# Patient Record
Sex: Male | Born: 1945 | Race: White | Hispanic: No | Smoking: Former smoker
Health system: Southern US, Community
[De-identification: ages and names within clinical notes are randomized; demographics above are authoritative.]

## PROBLEM LIST (undated history)

## (undated) DIAGNOSIS — M3130 Wegener's granulomatosis without renal involvement: Secondary | ICD-10-CM

## (undated) DIAGNOSIS — D332 Benign neoplasm of brain, unspecified: Secondary | ICD-10-CM

## (undated) DIAGNOSIS — G20A1 Parkinson's disease without dyskinesia, without mention of fluctuations: Secondary | ICD-10-CM

## (undated) DIAGNOSIS — I10 Essential (primary) hypertension: Secondary | ICD-10-CM

## (undated) DIAGNOSIS — F4310 Post-traumatic stress disorder, unspecified: Secondary | ICD-10-CM

## (undated) DIAGNOSIS — G20 Parkinson's disease: Secondary | ICD-10-CM

## (undated) HISTORY — PX: APPENDECTOMY: SHX54

## (undated) HISTORY — PX: NASAL SINUS SURGERY: SHX719

---

## 2004-03-11 ENCOUNTER — Other Ambulatory Visit: Payer: Self-pay

## 2007-02-10 ENCOUNTER — Emergency Department: Payer: Self-pay | Admitting: Unknown Physician Specialty

## 2007-02-10 ENCOUNTER — Other Ambulatory Visit: Payer: Self-pay

## 2011-08-10 ENCOUNTER — Ambulatory Visit: Payer: Self-pay | Admitting: Family Medicine

## 2011-09-15 ENCOUNTER — Ambulatory Visit: Payer: Self-pay | Admitting: Unknown Physician Specialty

## 2011-09-27 ENCOUNTER — Ambulatory Visit: Payer: Self-pay | Admitting: Unknown Physician Specialty

## 2011-10-04 ENCOUNTER — Inpatient Hospital Stay: Payer: Self-pay | Admitting: Unknown Physician Specialty

## 2011-10-08 ENCOUNTER — Encounter: Payer: Self-pay | Admitting: Internal Medicine

## 2011-10-22 ENCOUNTER — Encounter: Payer: Self-pay | Admitting: Internal Medicine

## 2013-07-28 ENCOUNTER — Emergency Department: Payer: Self-pay | Admitting: Emergency Medicine

## 2013-07-28 LAB — COMPREHENSIVE METABOLIC PANEL
BUN: 28 mg/dL — ABNORMAL HIGH (ref 7–18)
Bilirubin,Total: 0.3 mg/dL (ref 0.2–1.0)
Creatinine: 1.61 mg/dL — ABNORMAL HIGH (ref 0.60–1.30)
Osmolality: 286 (ref 275–301)
Potassium: 4 mmol/L (ref 3.5–5.1)
SGPT (ALT): 26 U/L (ref 12–78)
Sodium: 140 mmol/L (ref 136–145)

## 2013-07-28 LAB — URINALYSIS, COMPLETE
Bilirubin,UR: NEGATIVE
Glucose,UR: NEGATIVE mg/dL (ref 0–75)
Leukocyte Esterase: NEGATIVE
Nitrite: NEGATIVE
Ph: 7 (ref 4.5–8.0)
Protein: 30
RBC,UR: <1 /HPF (ref 0–5)
Specific Gravity: 1.016 (ref 1.003–1.030)
Squamous Epithelial: NONE SEEN

## 2013-07-28 LAB — CBC
HGB: 14.4 g/dL (ref 13.0–18.0)
MCH: 30.1 pg (ref 26.0–34.0)
MCHC: 33.9 g/dL (ref 32.0–36.0)
MCV: 89 fL (ref 80–100)
Platelet: 212 10*3/uL (ref 150–440)

## 2013-07-28 LAB — LIPASE, BLOOD: Lipase: 173 U/L (ref 73–393)

## 2014-03-17 ENCOUNTER — Ambulatory Visit: Payer: Self-pay | Admitting: Podiatry

## 2022-01-14 ENCOUNTER — Other Ambulatory Visit: Payer: Self-pay

## 2022-01-14 ENCOUNTER — Emergency Department: Payer: No Typology Code available for payment source

## 2022-01-14 ENCOUNTER — Encounter: Payer: Self-pay | Admitting: Intensive Care

## 2022-01-14 ENCOUNTER — Emergency Department
Admission: EM | Admit: 2022-01-14 | Discharge: 2022-01-14 | Disposition: A | Discharge status: Home / Self Care | Payer: No Typology Code available for payment source | Attending: Emergency Medicine | Admitting: Emergency Medicine

## 2022-01-14 DIAGNOSIS — W19XXXA Unspecified fall, initial encounter: Secondary | ICD-10-CM | POA: Diagnosis not present

## 2022-01-14 DIAGNOSIS — W01198A Fall on same level from slipping, tripping and stumbling with subsequent striking against other object, initial encounter: Secondary | ICD-10-CM | POA: Diagnosis not present

## 2022-01-14 DIAGNOSIS — M542 Cervicalgia: Secondary | ICD-10-CM | POA: Insufficient documentation

## 2022-01-14 DIAGNOSIS — M4312 Spondylolisthesis, cervical region: Secondary | ICD-10-CM | POA: Diagnosis not present

## 2022-01-14 DIAGNOSIS — R55 Syncope and collapse: Secondary | ICD-10-CM | POA: Diagnosis not present

## 2022-01-14 DIAGNOSIS — G20 Parkinson's disease: Secondary | ICD-10-CM | POA: Insufficient documentation

## 2022-01-14 DIAGNOSIS — I10 Essential (primary) hypertension: Secondary | ICD-10-CM | POA: Insufficient documentation

## 2022-01-14 DIAGNOSIS — S0181XA Laceration without foreign body of other part of head, initial encounter: Secondary | ICD-10-CM | POA: Insufficient documentation

## 2022-01-14 DIAGNOSIS — M47812 Spondylosis without myelopathy or radiculopathy, cervical region: Secondary | ICD-10-CM | POA: Diagnosis not present

## 2022-01-14 DIAGNOSIS — S0990XA Unspecified injury of head, initial encounter: Secondary | ICD-10-CM | POA: Diagnosis present

## 2022-01-14 DIAGNOSIS — M4692 Unspecified inflammatory spondylopathy, cervical region: Secondary | ICD-10-CM | POA: Diagnosis not present

## 2022-01-14 DIAGNOSIS — S199XXA Unspecified injury of neck, initial encounter: Secondary | ICD-10-CM | POA: Diagnosis not present

## 2022-01-14 DIAGNOSIS — R42 Dizziness and giddiness: Secondary | ICD-10-CM | POA: Diagnosis not present

## 2022-01-14 DIAGNOSIS — R58 Hemorrhage, not elsewhere classified: Secondary | ICD-10-CM | POA: Diagnosis not present

## 2022-01-14 HISTORY — DX: Benign neoplasm of brain, unspecified: D33.2

## 2022-01-14 HISTORY — DX: Parkinson's disease: G20

## 2022-01-14 HISTORY — DX: Wegener's granulomatosis without renal involvement: M31.30

## 2022-01-14 HISTORY — DX: Parkinson's disease without dyskinesia, without mention of fluctuations: G20.A1

## 2022-01-14 HISTORY — DX: Essential (primary) hypertension: I10

## 2022-01-14 HISTORY — DX: Post-traumatic stress disorder, unspecified: F43.10

## 2022-01-14 LAB — BASIC METABOLIC PANEL
Anion gap: 7 (ref 5–15)
BUN: 18 mg/dL (ref 8–23)
CO2: 31 mmol/L (ref 22–32)
Calcium: 9 mg/dL (ref 8.9–10.3)
Chloride: 104 mmol/L (ref 98–111)
Creatinine, Ser: 0.96 mg/dL (ref 0.61–1.24)
GFR, Estimated: >60 mL/min (ref >60)
Glucose, Bld: 119 mg/dL — ABNORMAL HIGH (ref 70–99)
Potassium: 4.6 mmol/L (ref 3.5–5.1)
Sodium: 142 mmol/L (ref 135–145)

## 2022-01-14 LAB — CBC
HCT: 40.7 % (ref 39.0–52.0)
Hemoglobin: 12.6 g/dL — ABNORMAL LOW (ref 13.0–17.0)
MCH: 30.1 pg (ref 26.0–34.0)
MCHC: 31 g/dL (ref 30.0–36.0)
MCV: 97.4 fL (ref 80.0–100.0)
Platelets: 184 10*3/uL (ref 150–400)
RBC: 4.18 MIL/uL — ABNORMAL LOW (ref 4.22–5.81)
RDW: 14.6 % (ref 11.5–15.5)
WBC: 4.2 10*3/uL (ref 4.0–10.5)
nRBC: 0 % (ref 0.0–0.2)

## 2022-01-14 MED ORDER — LIDOCAINE HCL (PF) 1 % IJ SOLN
10.0000 mL | Freq: Once | INTRAMUSCULAR | Status: AC
  Administered 2022-01-14: 10 mL
  Filled 2022-01-14: qty 10

## 2022-01-14 NOTE — ED Triage Notes (Signed)
Pt comes into the ED via [facility] from home c/o dizziness and fall.  Laceration over the left eye with all bleeding under control.  Pt denies any LOC, and denies any Rx blood thinners.  Pt states he got dizzy when he was going from a sitting to standing position.  Pt in NAd with even and unlabored respirations. VSS stable.

## 2022-01-14 NOTE — ED Provider Notes (Signed)
[facility] Provider Note    Event Date/Time   First MD Initiated Contact with Patient 01/14/22 1141     (approximate)   History   Chief Complaint No chief complaint on file.   HPI Rodney Richmond is a 76 y.o. male, history of Parkinson's, hypertension, benign brain tumor, PTSD, and Wegener's granulomatosis, presents to the emergency department for evaluation of injury sustained from fall.  Patient states that he got dizzy after getting up from the couch to walk to the bathroom.  Reports feeling weak and lightheaded, causing him to fall forward and hit his head on the ground.  Denies LOC.  Denies blood thinner use.  Denies nausea/vomiting following the injury.  Patient is currently endorsing mild headache and neck pain.  Denies chest pain, shortness of breath, abdominal pain, back pain, numbness/tingling in upper or lower extremities, weakness, or urinary symptoms  History Limitations: No limitations.      Physical Exam  Triage Vital Signs: ED Triage Vitals  Enc Vitals Group     BP 01/14/22 1105 132/76     Pulse Rate 01/14/22 1105 79     Resp 01/14/22 1105 16     Temp 01/14/22 1105 97.7 F (36.5 C)     Temp Source 01/14/22 1105 Oral     SpO2 01/14/22 1105 96 %     Weight 01/14/22 1125 215 lb (97.5 kg)     Height 01/14/22 1125 6' (1.829 m)     Head Circumference --      Peak Flow --      Pain Score 01/14/22 1105 2     Pain Loc --      Pain Edu? --      Excl. in [place]? --     Most recent vital signs: Vitals:   01/14/22 1105 01/14/22 1451  BP: 132/76 128/80  Pulse: 79 78  Resp: 16 16  Temp: 97.7 F (36.5 C)   SpO2: 96% 97%    General: Awake, NAD.  CV: Good peripheral perfusion.  Resp: Normal effort.  Lung sounds clear bilaterally Abd: Soft, non-tender. No distention.  Neuro: At baseline. No gross neurological deficits.  Cranial nerves II through XII intact.  Normal strength and sensation in upper and lower extremities. Other: 3 cm linear  laceration appreciated above the left orbit.  No active bleeding or discharge no maxillofacial deformities or tenderness.  No midline cervical spine tenderness.  Gross visual acuity intact.  Pupils equal, round, reactive to light.   Physical Exam    ED Results / Procedures / Treatments  Labs (all labs ordered are listed, but only abnormal results are displayed) Labs Reviewed  BASIC METABOLIC PANEL - Abnormal; Notable for the following components:      Result Value   Glucose, Bld 119 (*)    All other components within normal limits  CBC - Abnormal; Notable for the following components:   RBC 4.18 (*)    Hemoglobin 12.6 (*)    All other components within normal limits  URINALYSIS, ROUTINE W REFLEX MICROSCOPIC     EKG Sinus rhythm, rate of 79, no ST segment changes, no axis deviations, normal QT interval, no AV blocks.   RADIOLOGY  ED Provider Interpretation: I personally reviewed and interpreted these images.  CT head shows no evidence of acute intracranial injury.  Cervical spine CT shows no evidence of acute fractures  CT HEAD WO CONTRAST  Result Date: 01/14/2022 CLINICAL DATA:  Dizziness, fall, LEFT supraorbital laceration and  bleeding, denies loss of consciousness. History hypertension, Parkinson's disease; EHR indicates history of "benign brain tumor" EXAM: CT HEAD WITHOUT CONTRAST TECHNIQUE: Contiguous axial images were obtained from the base of the skull through the vertex without intravenous contrast. RADIATION DOSE REDUCTION: This exam was performed according to the departmental dose-optimization program which includes automated exposure control, adjustment of the mA and/or kV according to patient size and/or use of iterative reconstruction technique. COMPARISON:  08/10/2011 FINDINGS: Brain: Generalized atrophy. Normal ventricular morphology. No midline shift or mass effect. Small vessel chronic ischemic changes of deep cerebral white matter. No intracranial hemorrhage or  evidence of acute infarction. Slightly hyperdense mass identified peripherally at posterior aspect of LEFT posterior cranial fossa, 26 x 15 x 12 mm, question meningioma. No additional masses or extra-axial fluid collections. Vascular: No hyperdense vessels Skull: Intact Sinuses/Orbits: Chronic osseous thickening of the walls of the paranasal sinuses suggesting chronic sinusitis. Mastoid air cells clear. Other: N/A IMPRESSION: Atrophy with small vessel chronic ischemic changes of deep cerebral white matter. No acute intracranial abnormalities. 26 x 15 x 12 mm peripherally hyperdense mass at the posterior aspect of the LEFT posterior cranial fossa, question meningioma, recommend correlation with patient history; if further imaging is indicated, MRI brain with and without contrast would be the exam of choice. Changes of diffuse chronic sinusitis. Electronically Signed   By: Lavonia Dana M.D.   On: 01/14/2022 12:47   CT Cervical Spine Wo Contrast  Result Date: 01/14/2022 CLINICAL DATA:  Neck trauma EXAM: CT CERVICAL SPINE WITHOUT CONTRAST TECHNIQUE: Multidetector CT imaging of the cervical spine was performed without intravenous contrast. Multiplanar CT image reconstructions were also generated. RADIATION DOSE REDUCTION: This exam was performed according to the departmental dose-optimization program which includes automated exposure control, adjustment of the mA and/or kV according to patient size and/or use of iterative reconstruction technique. COMPARISON:  None. FINDINGS: Alignment: Facet joints are aligned without dislocation or traumatic listhesis. Dens and lateral masses are aligned. Degenerative facet-mediated grade 1 anterolisthesis of C3 on C4 and C4 on C5. Skull base and vertebrae: No acute fracture. No primary bone lesion or focal pathologic process. Soft tissues and spinal canal: No prevertebral fluid or swelling. No visible canal hematoma. Disc levels: Degenerative disc disease most pronounced at the  C6-7 level. Advanced multilevel facet arthropathy most pronounced on the left at C3-4 and on the right at C5-6. Upper chest: Included lung apices are clear. Other: None. IMPRESSION: 1. No acute fracture or traumatic listhesis of the cervical spine. 2. Multilevel cervical spondylosis. Electronically Signed   By: Davina Poke D.O.   On: 01/14/2022 12:41    PROCEDURES:  Critical Care performed: None.  Marland Kitchen.Laceration Repair  Date/Time: 01/14/2022 4:35 PM Performed by: Teodoro Spray, PA Authorized by: Teodoro Spray, PA   Consent:    Consent obtained:  Verbal   Consent given by:  Patient   Risks, benefits, and alternatives were discussed: yes     Risks discussed:  Infection, pain and poor cosmetic result Universal protocol:    Patient identity confirmed:  Verbally with patient Anesthesia:    Anesthesia method:  Local infiltration   Local anesthetic:  Lidocaine 1% w/o epi Laceration details:    Location:  Face   Face location:  Forehead   Length (cm):  3   Depth (mm):  2 Pre-procedure details:    Preparation:  Patient was prepped and draped in usual sterile fashion Exploration:    Hemostasis achieved with:  Direct pressure   Imaging  outcome: foreign body not noted     Wound exploration: wound explored through full range of motion and entire depth of wound visualized     Wound extent: no foreign bodies/material noted, no underlying fracture noted and no vascular damage noted   Treatment:    Area cleansed with:  Saline   Amount of cleaning:  Standard   Irrigation solution:  Sterile saline   Irrigation volume:  1000   Irrigation method:  Pressure wash   Visualized foreign bodies/material removed: no     Debridement:  None   Undermining:  None Skin repair:    Repair method:  Sutures   Suture size:  5-0   Suture material:  Prolene   Suture technique:  Simple interrupted   Number of sutures:  1 Approximation:    Approximation:  Close Repair type:    Repair type:   Simple Post-procedure details:    Dressing:  Sterile dressing and tube gauze   Procedure completion:  Tolerated well, no immediate complications    MEDICATIONS ORDERED IN ED: Medications  lidocaine (PF) (XYLOCAINE) 1 % injection 10 mL (10 mLs Infiltration Given 01/14/22 1300)     IMPRESSION / MDM / [place] / ED COURSE  I reviewed the triage vital signs and the nursing notes.                              Rodney Richmond is a 76 y.o. male, history of Parkinson's, hypertension, benign brain tumor, PTSD, and Wegener's granulomatosis, presents to the emergency department for evaluation of injury sustained from fall.  Patient states that he got dizzy after getting up from the couch to walk to the bathroom.  Reports feeling weak and lightheaded, causing him to fall forward and hit his head on the ground.  Denies LOC.  Denies blood thinner use.  Denies nausea/vomiting following the injury.  Patient is currently endorsing mild headache and neck pain.  Differential diagnosis includes, but is not limited to, facial laceration, concussion, epidural/subdural hematoma, cervical spine fracture, cervical strain  ED Course Patient appears well.  Vital signs within normal limits.  NAD.  CBC unremarkable for leukocytosis or clinically significant anemia.  BMP unremarkable for electrolyte abnormalities or evidence of kidney injury.  CT head shows no acute intracranial findings.  Hyperdense mass noted (see above).  Notified the patient and his wife, who states that they are aware of this mass and is being followed up outpatient.  Laceration anesthetized with 1% lidocaine.  Repaired utilizing 5-0 sutures, simple running.  Patient tolerated the procedure well with no complications.   Assessment/Plan Given the patient's history, physical exam, and work-up, I do not suspect any serious or life-threatening pathology.  CT imaging reassuring for no intracranial hemorrhage.  Although, I do suspect  that the patient likely experienced a concussion given the mechanism.  Provided the patient and his wife with anticipatory guidance about this.  Laceration was repaired with no complications.  Advised him to follow-up here, urgent care, or with her PCP in 7 days for suture removal.  Patient was provided with anticipatory guidance, return precautions, and educational material. Encouraged the patient to return to the emergency department at any time if they begin to experience any new or worsening symptoms.       FINAL CLINICAL IMPRESSION(S) / ED DIAGNOSES   Final diagnoses:  Fall, initial encounter     Rx / DC Orders   ED Discharge  Orders     None        Note:  This document was prepared using Dragon voice recognition software and may include unintentional dictation errors.   Teodoro Spray, [place] 01/14/22 1638    Lucrezia Starch, MD 01/14/22 2137

## 2022-01-14 NOTE — ED Notes (Signed)
Pt to ED for fall from home, pt experiences frequent falls (3-4 times last 2 weeks, before that last time was 11/22. Pt walks with walker usually, was using cane this morning and his legs "gave out". Wife at bedside. Pt has wrapping/bandage to forehead. Pt has laceration to L forehead.  Pt denies LOC.  Pt also experiences orthostatic hypotension and has had falls from this. Only blood thinner pt takes is low dose aspirin.

## 2022-01-14 NOTE — Discharge Instructions (Addendum)
-  Treat pain with Tylenol/ibuprofen as needed. -Follow-up here, urgent care, or PCP in 7 days for suture removal -Return to the emergency department anytime if you begin to experience any new or worsening symptoms.

## 2022-01-17 DIAGNOSIS — R490 Dysphonia: Secondary | ICD-10-CM | POA: Diagnosis not present

## 2022-01-17 DIAGNOSIS — J310 Chronic rhinitis: Secondary | ICD-10-CM | POA: Diagnosis not present

## 2022-01-17 DIAGNOSIS — R1312 Dysphagia, oropharyngeal phase: Secondary | ICD-10-CM | POA: Diagnosis not present

## 2022-01-17 DIAGNOSIS — R1314 Dysphagia, pharyngoesophageal phase: Secondary | ICD-10-CM | POA: Diagnosis not present

## 2022-01-17 DIAGNOSIS — G20 Parkinson's disease: Secondary | ICD-10-CM | POA: Diagnosis not present

## 2022-01-20 DIAGNOSIS — L821 Other seborrheic keratosis: Secondary | ICD-10-CM | POA: Diagnosis not present

## 2022-01-20 DIAGNOSIS — L853 Xerosis cutis: Secondary | ICD-10-CM | POA: Diagnosis not present

## 2022-01-20 DIAGNOSIS — L218 Other seborrheic dermatitis: Secondary | ICD-10-CM | POA: Diagnosis not present

## 2022-01-22 ENCOUNTER — Other Ambulatory Visit: Payer: Self-pay

## 2022-01-22 ENCOUNTER — Ambulatory Visit
Admission: EM | Admit: 2022-01-22 | Discharge: 2022-01-22 | Disposition: A | Discharge status: Home / Self Care | Payer: Non-veteran care

## 2022-01-22 ENCOUNTER — Encounter: Payer: Self-pay | Admitting: Emergency Medicine

## 2022-01-22 DIAGNOSIS — Z4802 Encounter for removal of sutures: Secondary | ICD-10-CM

## 2022-01-22 DIAGNOSIS — S01112A Laceration without foreign body of left eyelid and periocular area, initial encounter: Secondary | ICD-10-CM

## 2022-01-22 NOTE — ED Triage Notes (Signed)
Patient is here for suture removal to his left eyebrow.  Patient had them placed at the [facility] ED.  Patient thinks there are 11 or 12 sutures.   ?

## 2022-01-22 NOTE — ED Provider Notes (Addendum)
?[facility] ? ? ? ?CSN: [id number] ?Arrival date & time: 01/22/22  1327 ? ? ?  ? ?History   ?Chief Complaint ?Chief Complaint  ?Patient presents with  ? Suture / Staple Removal  ?  Provider visit  ? ? ?HPI ?ANDRUS SHARP is a 76 y.o. male.  ? ?Patient presents for suture removal, sutures placed at [facility] on 01/14/2022 after fall occurred.  Denies increased swelling, increased pain, drainage, fever or chills. ? ?Past Medical History:  ?Diagnosis Date  ? Benign brain tumor ([place])   ? Hypertension   ? Parkinson disease ([place])   ? PTSD (post-traumatic stress disorder)   ? 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 ? ?There are no problems to display for this patient. ? ? ?Past Surgical History:  ?Procedure Laterality Date  ? APPENDECTOMY    ? NASAL SINUS SURGERY    ? ? ? ? ? ?Home Medications   ? ?Prior to Admission medications   ?Medication Sig Start Date End Date Taking? Authorizing Provider  ?buPROPion (WELLBUTRIN) 100 MG tablet Take 100 mg by mouth 2 (two) times daily.   Yes [provider]  ?carbidopa-levodopa (SINEMET IR) 25-100 MG tablet Take 1 tablet by mouth 3 (three) times daily.   Yes [provider]  ?Cholecalciferol 25 MCG (1000 UT) tablet TAKE THREE TABLETS BY MOUTH EVERY DAY FOR BONE STRENGTH, IMMUNE RESPONSE, NERVOUS SYSTEM  REPLACES "CALCIUM '600MG'$ /VITAMIN D400UNITS" FOR BONE STRENGTH, IMMUNE RESPONSE, NERVOUS SYSTEM  REPLACES "CALCIUM '600MG'$ /VITAMIN D400UNITS" 06/22/21  Yes [provider]  ?clonazePAM (KLONOPIN) 1 MG tablet TAKE ONE AND ONE-HALF TABLETS BY MOUTH AT BEDTIME REM SLEEP BEHAVIOR DISORDER 10/21/21  Yes [provider]  ?cyanocobalamin 1000 MCG tablet TAKE ONE TABLET BY MOUTH EVERY DAY FOR GI HEALTH 06/22/21  Yes [provider]  ?diltiazem (CARDIZEM) 30 MG tablet Take 30 mg by mouth 4 (four) times daily.   Yes [provider]  ?finasteride (PROSCAR) 5 MG tablet TAKE ONE TABLET BY MOUTH ONCE EVERY DAY FOR PROSTATE  01/05/22  Yes [provider]  ?folic acid (FOLVITE) 1 MG tablet Take 1 tablet by mouth daily. 06/22/21  Yes [provider]  ?lisinopril (ZESTRIL) 10 MG tablet Take 1 tablet by mouth daily. 11/18/21  Yes [provider]  ?methotrexate (RHEUMATREX) 2.5 MG tablet TAKE EIGHT TABLETS BY MOUTH ONCE EACH WEEK - TAKE ON AN EMPTY STOMACH ONCE A WEEK THE SAME DAY OF THE WEEK 03/19/21  Yes [provider]  ?metoprolol tartrate (LOPRESSOR) 25 MG tablet TAKE ONE TABLET BY MOUTH TWO TIMES A DAY FO BLOOD PRESSURE AND HEART 11/18/21  Yes [provider]  ?mirabegron ER (MYRBETRIQ) 50 MG TB24 tablet Take 1 tablet by mouth daily. 01/05/22  Yes [provider]  ?tamsulosin (FLOMAX) 0.4 MG CAPS capsule TAKE ONE CAPSULE BY MOUTH EVERY DAY - INCREASED DOSE JUNE 2018 11/18/21  Yes [provider]  ?traZODone (DESYREL) 50 MG tablet TAKE 1-2 TABLETS BY MOUTH AT BEDTIME (FOR SLEEP AND MOOD) 06/21/21  Yes [provider]  ?venlafaxine XR (EFFEXOR-XR) 150 MG 24 hr capsule TAKE ONE CAPSULE BY MOUTH AT BEDTIME FOR ANXIETY, MOOD, AND POSTTRAUMATIC STRESS DISORDER 06/21/21  Yes [provider]  ?zinc sulfate 220 (50 Zn) MG capsule TAKE ONE CAPSULE BY MOUTH EVERY DAY PT REQUEST FOR IMMUNE SUPPORT 09/04/19  Yes [provider]  ? ? ?Family History ?History reviewed. No pertinent family history. ? ?Social History ?Social History  ? ?Tobacco Use  ?  Smoking status: Former  ?  Types: Cigarettes  ? Smokeless tobacco: Never  ?Vaping Use  ? Vaping Use: Never used  ?Substance Use Topics  ? Alcohol use: Never  ? Drug use: Never  ? ? ? ?Allergies   ?Patient has no known allergies. ? ? ?Review of Systems ?Review of Systems ? ? ?Physical Exam ?Triage Vital Signs ?ED Triage Vitals  ?Enc Vitals Group  ?   BP 01/22/22 1406 127/72  ?   Pulse Rate 01/22/22 1406 79  ?   Resp 01/22/22 1406 15  ?   Temp 01/22/22 1406 97.8 ?F (36.6 ?C)  ?   Temp Source 01/22/22 1406 Oral  ?   SpO2  01/22/22 1406 99 %  ?   Weight 01/22/22 1401 215 lb (97.5 kg)  ?   Height 01/22/22 1401 6' (1.829 m)  ?   Head Circumference --   ?   Peak Flow --   ?   Pain Score 01/22/22 1401 0  ?   Pain Loc --   ?   Pain Edu? --   ?   Excl. in [place]? --   ? ?No data found. ? ?Updated Vital Signs ?BP 127/72 (BP Location: Left Arm)   Pulse 79   Temp 97.8 ?F (36.6 ?C) (Oral)   Resp 15   Ht 6' (1.829 m)   Wt 215 lb (97.5 kg)   SpO2 99%   BMI 29.16 kg/m?  ? ?Visual Acuity ?Right Eye Distance:   ?Left Eye Distance:   ?Bilateral Distance:   ? ?Right Eye Near:   ?Left Eye Near:    ?Bilateral Near:    ? ?Physical Exam ?Constitutional:   ?   Appearance: Normal appearance.  ?Eyes:  ?   Extraocular Movements: Extraocular movements intact.  ?Pulmonary:  ?   Effort: Pulmonary effort is normal.  ?Skin: ?   Comments: 9 single interrupted sutures  are noted to the left eyebrow, no signs of infection  ?Neurological:  ?   Mental Status: He is alert and oriented to person, place, and time. Mental status is at baseline.  ?Psychiatric:     ?   Mood and Affect: Mood normal.     ?   Behavior: Behavior normal.  ? ? ? ?UC Treatments / Results  ?Labs ?(all labs ordered are listed, but only abnormal results are displayed) ?Labs Reviewed - No data to display ? ?EKG ? ? ?Radiology ?No results found. ? ?Procedures ?Procedures (including critical care time) ? ?Medications Ordered in UC ?Medications - No data to display ? ?Initial Impression / Assessment and Plan / UC Course  ?I have reviewed the triage vital signs and the nursing notes. ? ?Pertinent labs & imaging results that were available during my care of the patient were reviewed by me and considered in my medical decision making (see chart for details). ? ?Laceration of left elbow, initial encounter ? ? ?1 uninterrupted seizure removed from the left eyebrow by nursing staff, no signs of infection at this time, site has appeared appropriately to heal appropriately, no further concerns at this time,  may follow-up with urgent care as needed ?Final Clinical Impressions(s) / UC Diagnoses  ? ?Final diagnoses:  ?None  ? ?Discharge Instructions   ?None ?  ? ?ED Prescriptions   ?None ?  ? ?PDMP not reviewed this encounter. ?  ?Hans Eden, NP ?01/22/22 1426 ? ?  ?Hans Eden, NP ?01/22/22 1440 ? ?

## 2022-01-22 NOTE — Discharge Instructions (Addendum)
Site appears to have healed appropriately and should continue to do so without complication ? ?Entirety of suture removed today ? ?As always she can continue to monitor for for signs of infection such as increased pain, increased swelling, drainage or new fever or chills, if this occurs at any point you may follow-up with urgent care ?

## 2022-01-22 NOTE — ED Notes (Addendum)
NA

## 2022-03-02 DIAGNOSIS — J310 Chronic rhinitis: Secondary | ICD-10-CM | POA: Diagnosis not present

## 2022-03-02 DIAGNOSIS — J3489 Other specified disorders of nose and nasal sinuses: Secondary | ICD-10-CM | POA: Diagnosis not present

## 2022-04-27 DIAGNOSIS — L218 Other seborrheic dermatitis: Secondary | ICD-10-CM | POA: Diagnosis not present

## 2022-04-27 DIAGNOSIS — Z872 Personal history of diseases of the skin and subcutaneous tissue: Secondary | ICD-10-CM | POA: Diagnosis not present

## 2022-04-27 DIAGNOSIS — L578 Other skin changes due to chronic exposure to nonionizing radiation: Secondary | ICD-10-CM | POA: Diagnosis not present

## 2022-04-27 DIAGNOSIS — Z86018 Personal history of other benign neoplasm: Secondary | ICD-10-CM | POA: Diagnosis not present

## 2022-08-04 DIAGNOSIS — S0590XA Unspecified injury of unspecified eye and orbit, initial encounter: Secondary | ICD-10-CM | POA: Diagnosis not present

## 2022-10-26 DIAGNOSIS — L853 Xerosis cutis: Secondary | ICD-10-CM | POA: Diagnosis not present

## 2022-10-26 DIAGNOSIS — L298 Other pruritus: Secondary | ICD-10-CM | POA: Diagnosis not present

## 2022-10-26 DIAGNOSIS — R208 Other disturbances of skin sensation: Secondary | ICD-10-CM | POA: Diagnosis not present

## 2022-11-30 DIAGNOSIS — G20B1 Parkinson's disease with dyskinesia, without mention of fluctuations: Secondary | ICD-10-CM | POA: Diagnosis not present

## 2022-11-30 DIAGNOSIS — J310 Chronic rhinitis: Secondary | ICD-10-CM | POA: Diagnosis not present

## 2022-11-30 DIAGNOSIS — J3489 Other specified disorders of nose and nasal sinuses: Secondary | ICD-10-CM | POA: Diagnosis not present

## 2022-11-30 DIAGNOSIS — M3130 Wegener's granulomatosis without renal involvement: Secondary | ICD-10-CM | POA: Diagnosis not present

## 2023-01-26 DIAGNOSIS — G20C Parkinsonism, unspecified: Secondary | ICD-10-CM | POA: Diagnosis not present

## 2023-01-26 DIAGNOSIS — R42 Dizziness and giddiness: Secondary | ICD-10-CM | POA: Diagnosis not present

## 2023-01-26 DIAGNOSIS — M47812 Spondylosis without myelopathy or radiculopathy, cervical region: Secondary | ICD-10-CM | POA: Diagnosis not present

## 2023-01-26 DIAGNOSIS — R0902 Hypoxemia: Secondary | ICD-10-CM | POA: Diagnosis not present

## 2023-01-26 DIAGNOSIS — M4312 Spondylolisthesis, cervical region: Secondary | ICD-10-CM | POA: Diagnosis not present

## 2023-01-26 DIAGNOSIS — I10 Essential (primary) hypertension: Secondary | ICD-10-CM | POA: Diagnosis not present

## 2023-01-26 DIAGNOSIS — Z043 Encounter for examination and observation following other accident: Secondary | ICD-10-CM | POA: Diagnosis not present

## 2023-01-26 DIAGNOSIS — F4310 Post-traumatic stress disorder, unspecified: Secondary | ICD-10-CM | POA: Diagnosis not present

## 2023-01-26 DIAGNOSIS — S060X0A Concussion without loss of consciousness, initial encounter: Secondary | ICD-10-CM | POA: Diagnosis not present

## 2023-01-26 DIAGNOSIS — E785 Hyperlipidemia, unspecified: Secondary | ICD-10-CM | POA: Diagnosis not present

## 2023-01-26 DIAGNOSIS — S0181XA Laceration without foreign body of other part of head, initial encounter: Secondary | ICD-10-CM | POA: Diagnosis not present

## 2023-01-26 DIAGNOSIS — R000 Tachycardia, unspecified: Secondary | ICD-10-CM | POA: Diagnosis not present

## 2023-01-26 DIAGNOSIS — S01111A Laceration without foreign body of right eyelid and periocular area, initial encounter: Secondary | ICD-10-CM | POA: Diagnosis not present

## 2023-01-26 DIAGNOSIS — M1711 Unilateral primary osteoarthritis, right knee: Secondary | ICD-10-CM | POA: Diagnosis not present

## 2023-01-26 DIAGNOSIS — S0990XA Unspecified injury of head, initial encounter: Secondary | ICD-10-CM | POA: Diagnosis not present

## 2023-01-26 DIAGNOSIS — W06XXXA Fall from bed, initial encounter: Secondary | ICD-10-CM | POA: Diagnosis not present

## 2023-01-26 DIAGNOSIS — W19XXXA Unspecified fall, initial encounter: Secondary | ICD-10-CM | POA: Diagnosis not present

## 2023-03-01 DIAGNOSIS — J3489 Other specified disorders of nose and nasal sinuses: Secondary | ICD-10-CM | POA: Diagnosis not present

## 2023-03-01 DIAGNOSIS — M3130 Wegener's granulomatosis without renal involvement: Secondary | ICD-10-CM | POA: Diagnosis not present

## 2023-03-01 DIAGNOSIS — J310 Chronic rhinitis: Secondary | ICD-10-CM | POA: Diagnosis not present

## 2023-03-09 DIAGNOSIS — W050XXA Fall from non-moving wheelchair, initial encounter: Secondary | ICD-10-CM | POA: Diagnosis not present

## 2023-03-09 DIAGNOSIS — M79645 Pain in left finger(s): Secondary | ICD-10-CM | POA: Diagnosis not present

## 2023-03-09 DIAGNOSIS — S63285A Dislocation of proximal interphalangeal joint of left ring finger, initial encounter: Secondary | ICD-10-CM | POA: Diagnosis not present

## 2023-03-09 DIAGNOSIS — I10 Essential (primary) hypertension: Secondary | ICD-10-CM | POA: Diagnosis not present

## 2023-03-09 DIAGNOSIS — I8002 Phlebitis and thrombophlebitis of superficial vessels of left lower extremity: Secondary | ICD-10-CM | POA: Diagnosis not present

## 2023-03-09 DIAGNOSIS — G20A1 Parkinson's disease without dyskinesia, without mention of fluctuations: Secondary | ICD-10-CM | POA: Diagnosis not present

## 2023-03-09 DIAGNOSIS — M25532 Pain in left wrist: Secondary | ICD-10-CM | POA: Diagnosis not present

## 2023-03-10 DIAGNOSIS — I809 Phlebitis and thrombophlebitis of unspecified site: Secondary | ICD-10-CM | POA: Diagnosis not present

## 2023-03-10 DIAGNOSIS — Z7901 Long term (current) use of anticoagulants: Secondary | ICD-10-CM | POA: Diagnosis not present

## 2023-03-10 DIAGNOSIS — E785 Hyperlipidemia, unspecified: Secondary | ICD-10-CM | POA: Insufficient documentation

## 2023-03-10 DIAGNOSIS — I82812 Embolism and thrombosis of superficial veins of left lower extremities: Secondary | ICD-10-CM | POA: Diagnosis not present

## 2023-03-10 DIAGNOSIS — M79606 Pain in leg, unspecified: Secondary | ICD-10-CM | POA: Diagnosis not present

## 2023-03-10 DIAGNOSIS — I10 Essential (primary) hypertension: Secondary | ICD-10-CM | POA: Diagnosis not present

## 2023-04-19 ENCOUNTER — Other Ambulatory Visit (INDEPENDENT_AMBULATORY_CARE_PROVIDER_SITE_OTHER): Payer: Self-pay | Admitting: Nurse Practitioner

## 2023-04-19 DIAGNOSIS — I8002 Phlebitis and thrombophlebitis of superficial vessels of left lower extremity: Secondary | ICD-10-CM

## 2023-04-21 ENCOUNTER — Encounter (INDEPENDENT_AMBULATORY_CARE_PROVIDER_SITE_OTHER): Payer: Self-pay | Admitting: Nurse Practitioner

## 2023-04-21 ENCOUNTER — Ambulatory Visit (INDEPENDENT_AMBULATORY_CARE_PROVIDER_SITE_OTHER): Payer: No Typology Code available for payment source

## 2023-04-21 ENCOUNTER — Ambulatory Visit (INDEPENDENT_AMBULATORY_CARE_PROVIDER_SITE_OTHER): Payer: No Typology Code available for payment source | Admitting: Nurse Practitioner

## 2023-04-21 VITALS — BP 118/72 | HR 83 | Resp 16

## 2023-04-21 DIAGNOSIS — I8002 Phlebitis and thrombophlebitis of superficial vessels of left lower extremity: Secondary | ICD-10-CM | POA: Diagnosis not present

## 2023-04-21 DIAGNOSIS — G20A1 Parkinson's disease without dyskinesia, without mention of fluctuations: Secondary | ICD-10-CM | POA: Diagnosis not present

## 2023-04-21 DIAGNOSIS — I82812 Embolism and thrombosis of superficial veins of left lower extremities: Secondary | ICD-10-CM | POA: Diagnosis not present

## 2023-04-21 DIAGNOSIS — E785 Hyperlipidemia, unspecified: Secondary | ICD-10-CM | POA: Diagnosis not present

## 2023-04-24 ENCOUNTER — Encounter (INDEPENDENT_AMBULATORY_CARE_PROVIDER_SITE_OTHER): Payer: Self-pay | Admitting: Nurse Practitioner

## 2023-04-24 DIAGNOSIS — G20C Parkinsonism, unspecified: Secondary | ICD-10-CM | POA: Insufficient documentation

## 2023-04-24 NOTE — Progress Notes (Signed)
Subjective:    Patient ID: Rodney Richmond, male    DOB: 04-30-1946, 77 y.o.   MRN: [id number] Chief Complaint  Patient presents with   New Patient (Initial Visit)    [facility] lle dvt.left GSV SVT on 4/19    Rodney Richmond is a 77 year old male who presents today for evaluation following a left lower extremity superficial thrombophlebitis.  He is maintained on Eliquis 2.5 mg twice daily.  This was initially diagnosed at [facility] following pain swelling and tenderness.  It was present from the mid thigh to mid calf.  No evidence of deep venous insufficiency was noted.  This was located in the great saphenous vein.  Today the patient notes that the swelling and tenderness is much improved.  There is still some visible evidence and hardness of the great saphenous vein.  However the studies today show that the thrombophlebitis has progressed from acute to chronic and there is improvement in the size is now only located in the thigh area.    Review of Systems  Cardiovascular:  Positive for leg swelling.  Neurological:  Positive for tremors and weakness.  All other systems reviewed and are negative.      Objective:   Physical Exam Vitals reviewed.  HENT:     Head: Normocephalic.  Cardiovascular:     Rate and Rhythm: Normal rate.  Pulmonary:     Effort: Pulmonary effort is normal.  Musculoskeletal:     Left lower leg: Edema present.  Skin:    General: Skin is warm and dry.  Neurological:     Mental Status: He is alert and oriented to person, place, and time.  Psychiatric:        Mood and Affect: Mood normal.        Behavior: Behavior normal.        Thought Content: Thought content normal.        Judgment: Judgment normal.     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)     Social History   Socioeconomic History   Marital status: Unknown    Spouse name: Not on file   Number of children: Not on file   Years of education: Not on file   Highest education level: Not on file  Occupational History   Not on file  Tobacco Use   Smoking status: Former    Types: Cigarettes   Smokeless tobacco: Never  Vaping Use   Vaping Use: Never used  Substance and Sexual Activity   Alcohol use: Never   Drug use: Never   Sexual activity: Not on file  Other Topics Concern   Not on file  Social History Narrative   Not on file   Social Determinants of Health   Financial Resource Strain: Not on file  Food Insecurity: Not on file  Transportation Needs: Not on file  Physical Activity: Not on file  Stress: Not on file  Social Connections: Not on file  Intimate Partner Violence: Not on file    Past Surgical History:  Procedure Laterality Date   APPENDECTOMY     NASAL SINUS SURGERY      Family History  Problem Relation Age of Onset   Varicose Veins Mother  Hypertension Mother    Cancer Mother    Cancer Father    Heart attack Father     Allergies  Allergen Reactions   Amlodipine Swelling   Hydrochlorothiazide Other (See Comments)    Other Reaction(s): Low blood pressure   Terazosin     Other Reaction(s): Dizziness, Syncope  Other Reaction(s): Syncope       Latest Ref Rng & Units 01/14/2022   11:39 AM 07/28/2013    2:33 AM  CBC  WBC 4.0 - 10.5 K/uL 4.2  9.5   Hemoglobin 13.0 - 17.0 g/dL 16.1  09.6   Hematocrit 39.0 - 52.0 % 40.7  42.4   Platelets 150 - 400 K/uL 184  212       CMP     Component Value Date/Time   NA 142 01/14/2022 1139   NA 140 07/28/2013 0233   K 4.6 01/14/2022 1139   K 4.0 07/28/2013 0233   CL 104 01/14/2022 1139   CL 108 (H) 07/28/2013 0233   CO2 31 01/14/2022 1139   CO2 29 07/28/2013 0233   GLUCOSE 119 (H) 01/14/2022 1139   GLUCOSE 111 (H) 07/28/2013 0233   BUN 18 01/14/2022 1139   BUN 28  (H) 07/28/2013 0233   CREATININE 0.96 01/14/2022 1139   CREATININE 1.61 (H) 07/28/2013 0233   CALCIUM 9.0 01/14/2022 1139   CALCIUM 8.4 (L) 07/28/2013 0233   PROT 6.2 (L) 07/28/2013 0233   ALBUMIN 3.4 07/28/2013 0233   AST 17 07/28/2013 0233   ALT 26 07/28/2013 0233   ALKPHOS 100 07/28/2013 0233   BILITOT 0.3 07/28/2013 0233   GFRNONAA >60 01/14/2022 1139   GFRNONAA 44 (L) 07/28/2013 0233   GFRAA 51 (L) 07/28/2013 0233     No results found.     Assessment & Plan:   1. Leg vein thromboembolism, superficial, left Today noninvasive studies show that the patient superficial phlebitis has changed from acute to chronic, which is expected over this timeframe.  There is also improvement as it is no longer located below the knee.  Patient will continue with Eliquis 2.5 mg as he is not having any issues.  Will plan on having the patient return in 3 months in order to reevaluate the progress to determine if it has completely resolved or if he still has some chronic thrombus remaining.  2. Hyperlipidemia, unspecified hyperlipidemia type Continue statin as ordered and reviewed, no changes at this time  3. Parkinson's disease, unspecified whether dyskinesia present, unspecified whether manifestations fluctuate With the patient's underlying Parkinson disease there is some limited mobility.  Discussed patient patient at increased risk for additional superficial bite as well as possible DVT.  It is recommended that the patient consider long-term anticoagulation.  We will try to reach out to the patient's PCP to discuss options.   Current Outpatient Medications on File Prior to Visit  Medication Sig Dispense Refill   ACIDOPHILUS LACTOBACILLUS PO Take 1 tablet by mouth daily.     apixaban (ELIQUIS) 2.5 MG TABS tablet Take 2.5 mg by mouth 2 (two) times daily.     atorvastatin (LIPITOR) 20 MG tablet Take 20 mg by mouth daily.     bacitracin (BACITRAYCIN PLUS) 500 UNIT/GM ointment Apply 1 Application  topically as needed for wound care.     buPROPion (WELLBUTRIN) 100 MG tablet Take 100 mg by mouth 2 (two) times daily.     carbamide peroxide (DEBROX) 6.5 % OTIC solution Place 5 drops into both ears 2 (two) times daily.  28 days     carbidopa-levodopa (SINEMET IR) 25-100 MG tablet Take 2 tablets by mouth 4 (four) times daily.     Cholecalciferol 25 MCG (1000 UT) tablet TAKE THREE TABLETS BY MOUTH EVERY DAY FOR BONE STRENGTH, IMMUNE RESPONSE, NERVOUS SYSTEM  REPLACES "CALCIUM 600MG /VITAMIN D400UNITS" FOR BONE STRENGTH, IMMUNE RESPONSE, NERVOUS SYSTEM  REPLACES "CALCIUM 600MG /VITAMIN D400UNITS"     finasteride (PROSCAR) 5 MG tablet TAKE ONE TABLET BY MOUTH ONCE EVERY DAY FOR PROSTATE     fluticasone (FLONASE) 50 MCG/ACT nasal spray Place 2 sprays into both nostrils daily.     folic acid (FOLVITE) 1 MG tablet Take 1 tablet by mouth daily.     gabapentin (NEURONTIN) 100 MG capsule Take 100 mg by mouth 2 (two) times daily.     Melatonin 5 MG CAPS Take 1 capsule by mouth at bedtime.     methotrexate (RHEUMATREX) 2.5 MG tablet TAKE EIGHT TABLETS BY MOUTH ONCE EACH WEEK - TAKE ON AN EMPTY STOMACH ONCE A WEEK THE SAME DAY OF THE WEEK     metoprolol tartrate (LOPRESSOR) 25 MG tablet TAKE ONE TABLET BY MOUTH TWO TIMES A DAY FO BLOOD PRESSURE AND HEART     Nutritional Supplements (ENSURE ORIGINAL) LIQD Take by mouth daily.     Pimavanserin Tartrate 34 MG CAPS Take 1 capsule by mouth daily.     sennosides-docusate sodium (SENOKOT-S) 8.6-50 MG tablet Take 1 tablet by mouth 2 (two) times daily.     silver sulfADIAZINE (SILVADENE) 1 % cream Apply 1 Application topically daily.     tamsulosin (FLOMAX) 0.4 MG CAPS capsule TAKE ONE CAPSULE BY MOUTH EVERY DAY - INCREASED DOSE JUNE 2018     traZODone (DESYREL) 50 MG tablet TAKE 1-2 TABLETS BY MOUTH AT BEDTIME (FOR SLEEP AND MOOD)     venlafaxine XR (EFFEXOR-XR) 150 MG 24 hr capsule TAKE ONE CAPSULE BY MOUTH AT BEDTIME FOR ANXIETY, MOOD, AND POSTTRAUMATIC STRESS  DISORDER     Wound Cleansers (SKINTEGRITY WOUND) LIQD Apply topically as needed.     clonazePAM (KLONOPIN) 1 MG tablet TAKE ONE AND ONE-HALF TABLETS BY MOUTH AT BEDTIME REM SLEEP BEHAVIOR DISORDER (Patient not taking: Reported on 04/21/2023)     cyanocobalamin 1000 MCG tablet TAKE ONE TABLET BY MOUTH EVERY DAY FOR GI HEALTH (Patient not taking: Reported on 04/21/2023)     diltiazem (CARDIZEM) 30 MG tablet Take 30 mg by mouth 4 (four) times daily. (Patient not taking: Reported on 04/21/2023)     lisinopril (ZESTRIL) 10 MG tablet Take 1 tablet by mouth daily. (Patient not taking: Reported on 04/21/2023)     mirabegron ER (MYRBETRIQ) 50 MG TB24 tablet Take 1 tablet by mouth daily. (Patient not taking: Reported on 04/21/2023)     zinc sulfate 220 (50 Zn) MG capsule TAKE ONE CAPSULE BY MOUTH EVERY DAY PT REQUEST FOR IMMUNE SUPPORT (Patient not taking: Reported on 04/21/2023)     No current facility-administered medications on file prior to visit.    There are no Patient Instructions on file for this visit. No follow-ups on file.   Georgiana Spinner, NP

## 2023-05-02 DIAGNOSIS — L578 Other skin changes due to chronic exposure to nonionizing radiation: Secondary | ICD-10-CM | POA: Diagnosis not present

## 2023-05-02 DIAGNOSIS — D485 Neoplasm of uncertain behavior of skin: Secondary | ICD-10-CM | POA: Diagnosis not present

## 2023-05-02 DIAGNOSIS — R208 Other disturbances of skin sensation: Secondary | ICD-10-CM | POA: Diagnosis not present

## 2023-05-02 DIAGNOSIS — Z86018 Personal history of other benign neoplasm: Secondary | ICD-10-CM | POA: Diagnosis not present

## 2023-05-02 DIAGNOSIS — Z872 Personal history of diseases of the skin and subcutaneous tissue: Secondary | ICD-10-CM | POA: Diagnosis not present

## 2023-05-02 DIAGNOSIS — L814 Other melanin hyperpigmentation: Secondary | ICD-10-CM | POA: Diagnosis not present

## 2023-06-21 DIAGNOSIS — G20B1 Parkinson's disease with dyskinesia, without mention of fluctuations: Secondary | ICD-10-CM | POA: Diagnosis not present

## 2023-06-21 DIAGNOSIS — J3489 Other specified disorders of nose and nasal sinuses: Secondary | ICD-10-CM | POA: Diagnosis not present

## 2023-06-21 DIAGNOSIS — M3130 Wegener's granulomatosis without renal involvement: Secondary | ICD-10-CM | POA: Diagnosis not present

## 2023-06-21 DIAGNOSIS — J310 Chronic rhinitis: Secondary | ICD-10-CM | POA: Diagnosis not present

## 2023-07-14 ENCOUNTER — Other Ambulatory Visit (INDEPENDENT_AMBULATORY_CARE_PROVIDER_SITE_OTHER): Payer: Self-pay | Admitting: Nurse Practitioner

## 2023-07-14 DIAGNOSIS — I82812 Embolism and thrombosis of superficial veins of left lower extremities: Secondary | ICD-10-CM

## 2023-07-25 ENCOUNTER — Ambulatory Visit (INDEPENDENT_AMBULATORY_CARE_PROVIDER_SITE_OTHER): Payer: No Typology Code available for payment source | Admitting: Nurse Practitioner

## 2023-07-25 ENCOUNTER — Encounter (INDEPENDENT_AMBULATORY_CARE_PROVIDER_SITE_OTHER): Payer: Self-pay | Admitting: Nurse Practitioner

## 2023-07-25 ENCOUNTER — Ambulatory Visit (INDEPENDENT_AMBULATORY_CARE_PROVIDER_SITE_OTHER): Payer: No Typology Code available for payment source

## 2023-07-25 VITALS — BP 118/71 | HR 99 | Resp 18 | Ht 72.0 in | Wt 215.0 lb

## 2023-07-25 DIAGNOSIS — I8002 Phlebitis and thrombophlebitis of superficial vessels of left lower extremity: Secondary | ICD-10-CM

## 2023-07-25 DIAGNOSIS — E785 Hyperlipidemia, unspecified: Secondary | ICD-10-CM

## 2023-07-25 DIAGNOSIS — G20A1 Parkinson's disease without dyskinesia, without mention of fluctuations: Secondary | ICD-10-CM | POA: Diagnosis not present

## 2023-07-25 DIAGNOSIS — I82812 Embolism and thrombosis of superficial veins of left lower extremities: Secondary | ICD-10-CM

## 2023-07-25 NOTE — Progress Notes (Signed)
Subjective:    Patient ID: Rodney Richmond, male    DOB: [birth year], 77 y.o.   MRN: [id number] Chief Complaint  Patient presents with   Follow-up    3 month follow up with BIL reflux    Rodney Richmond is a 77 year old male who presents today for evaluation following a left lower extremity superficial thrombophlebitis.  He is maintained on Eliquis 2.5 mg twice daily.  This was initially diagnosed at [facility] following pain swelling and tenderness.  It was present from the mid thigh to mid calf.  Today noninvasive studies show no evidence of superficial thrombophlebitis in the left lower extremity.  No evidence of DVT or superficial phlebitis bilaterally.  No evidence of deep venous insufficiency or superficial venous reflux bilaterally.  The large painful area has resolved.  He is doing well on his Eliquis.  He also notes that he has been working extensively with physical therapy.    Review of Systems  Cardiovascular:  Negative for leg swelling.  Neurological:  Positive for weakness.  All other systems reviewed and are negative.      Objective:   Physical Exam Vitals reviewed.  HENT:     Head: Normocephalic.  Cardiovascular:     Rate and Rhythm: Normal rate.  Pulmonary:     Effort: Pulmonary effort is normal.  Skin:    General: Skin is warm and dry.  Neurological:     Mental Status: He is alert and oriented to person, place, and time.     Motor: Weakness present.  Psychiatric:        Mood and Affect: Mood normal.        Behavior: Behavior normal.        Thought Content: Thought content normal.        Judgment: Judgment normal.     BP 118/71 (BP Location: Left Arm)   Pulse 99   Resp 18   Ht 6' (1.829 m)   Wt 215 lb (97.5 kg)   BMI 29.16 kg/m   Past Medical History:  Diagnosis Date   Benign brain tumor (HCC)    Hypertension    Parkinson disease    PTSD (post-traumatic stress disorder)    Wegener's granulomatosis without renal involvement (HCC)     Social History    Socioeconomic History   Marital status: Unknown    Spouse name: Not on file   Number of children: Not on file   Years of education: Not on file   Highest education level: Not on file  Occupational History   Not on file  Tobacco Use   Smoking status: Former    Types: Cigarettes   Smokeless tobacco: Never  Vaping Use   Vaping status: Never Used  Substance and Sexual Activity   Alcohol use: Never   Drug use: Never   Sexual activity: Not on file  Other Topics Concern   Not on file  Social History Narrative   Not on file   Social Determinants of Health   Financial Resource Strain: Low Risk  (01/26/2023)   Received from [facility], [facility]   Overall Financial Resource Strain (CARDIA)    Difficulty of Paying Living Expenses: Not hard at all  Food Insecurity: No Food Insecurity (01/26/2023)   Received from [facility], [facility]   Hunger Vital Sign    Worried About Running Out of Food in the Last Year: Never true    Ran Out of Food in the Last Year:  Never true  Transportation Needs: No Transportation Needs (01/26/2023)   Received from [facility], [facility]   [facility] - Transportation    Lack of Transportation (Medical): No    Lack of Transportation (Non-Medical): No  Physical Activity: Not on file  Stress: Not on file  Social Connections: Not on file  Intimate Partner Violence: Not on file    Past Surgical History:  Procedure Laterality Date   APPENDECTOMY     NASAL SINUS SURGERY      Family History  Problem Relation Age of Onset   Varicose Veins Mother    Hypertension Mother    Cancer Mother    Cancer Father    Heart attack Father     Allergies  Allergen Reactions   Amlodipine Swelling   Hydrochlorothiazide Other (See Comments)    Other Reaction(s): Low blood pressure   Terazosin     Other Reaction(s): Dizziness, Syncope  Other Reaction(s): Syncope       Latest Ref Rng & Units 01/14/2022   11:39 AM 07/28/2013    2:33 AM   CBC  WBC 4.0 - 10.5 K/uL 4.2  9.5   Hemoglobin 13.0 - 17.0 g/dL 29.5  62.1   Hematocrit 39.0 - 52.0 % 40.7  42.4   Platelets 150 - 400 K/uL 184  212       CMP     Component Value Date/Time   NA 142 01/14/2022 1139   NA 140 07/28/2013 0233   K 4.6 01/14/2022 1139   K 4.0 07/28/2013 0233   CL 104 01/14/2022 1139   CL 108 (H) 07/28/2013 0233   CO2 31 01/14/2022 1139   CO2 29 07/28/2013 0233   GLUCOSE 119 (H) 01/14/2022 1139   GLUCOSE 111 (H) 07/28/2013 0233   BUN 18 01/14/2022 1139   BUN 28 (H) 07/28/2013 0233   CREATININE 0.96 01/14/2022 1139   CREATININE 1.61 (H) 07/28/2013 0233   CALCIUM 9.0 01/14/2022 1139   CALCIUM 8.4 (L) 07/28/2013 0233   PROT 6.2 (L) 07/28/2013 0233   ALBUMIN 3.4 07/28/2013 0233   AST 17 07/28/2013 0233   ALT 26 07/28/2013 0233   ALKPHOS 100 07/28/2013 0233   BILITOT 0.3 07/28/2013 0233   GFRNONAA >60 01/14/2022 1139   GFRNONAA 44 (L) 07/28/2013 0233     No results found.     Assessment & Plan:   1. Superficial phlebitis and thrombophlebitis of left lower extremity Today the patient's evidence of thrombophlebitis has resolved.  Following this we discussed with the patient next steps for anticoagulation.  The patient is advised to finish his current Eliquis at 2.5 mg.  Following that he will resume taking 81 mg of aspirin which she was doing prior to his initial diagnosis of thrombophlebitis.  2. Hyperlipidemia, unspecified hyperlipidemia type Continue statin as ordered and reviewed, no changes at this time  3. Parkinson's disease, unspecified whether dyskinesia present, unspecified whether manifestations fluctuate Discussion with the patient and his wife.  He is consistently working with physical therapy.  We discussed that immobility may lead to recurrence of thrombophlebitis but at this time they are working diligently against that.   Current Outpatient Medications on File Prior to Visit  Medication Sig Dispense Refill   ACIDOPHILUS  LACTOBACILLUS PO Take 1 tablet by mouth daily.     apixaban (ELIQUIS) 2.5 MG TABS tablet Take 2.5 mg by mouth 2 (two) times daily.     atorvastatin (LIPITOR) 20 MG tablet Take 20 mg by mouth daily.  bacitracin (BACITRAYCIN PLUS) 500 UNIT/GM ointment Apply 1 Application topically as needed for wound care.     buPROPion (WELLBUTRIN) 100 MG tablet Take 100 mg by mouth 2 (two) times daily.     carbamide peroxide (DEBROX) 6.5 % OTIC solution Place 5 drops into both ears 2 (two) times daily. 28 days     carbidopa-levodopa (SINEMET IR) 25-100 MG tablet Take 2 tablets by mouth 4 (four) times daily.     Cholecalciferol 25 MCG (1000 UT) tablet TAKE THREE TABLETS BY MOUTH EVERY DAY FOR BONE STRENGTH, IMMUNE RESPONSE, NERVOUS SYSTEM  REPLACES "CALCIUM 600MG /VITAMIN D400UNITS" FOR BONE STRENGTH, IMMUNE RESPONSE, NERVOUS SYSTEM  REPLACES "CALCIUM 600MG /VITAMIN D400UNITS"     finasteride (PROSCAR) 5 MG tablet TAKE ONE TABLET BY MOUTH ONCE EVERY DAY FOR PROSTATE     fluticasone (FLONASE) 50 MCG/ACT nasal spray Place 2 sprays into both nostrils daily.     folic acid (FOLVITE) 1 MG tablet Take 1 tablet by mouth daily.     gabapentin (NEURONTIN) 100 MG capsule Take 100 mg by mouth 2 (two) times daily.     Melatonin 5 MG CAPS Take 1 capsule by mouth at bedtime.     methotrexate (RHEUMATREX) 2.5 MG tablet TAKE EIGHT TABLETS BY MOUTH ONCE EACH WEEK - TAKE ON AN EMPTY STOMACH ONCE A WEEK THE SAME DAY OF THE WEEK     metoprolol tartrate (LOPRESSOR) 25 MG tablet TAKE ONE TABLET BY MOUTH TWO TIMES A DAY FO BLOOD PRESSURE AND HEART     Nutritional Supplements (ENSURE ORIGINAL) LIQD Take by mouth daily.     Pimavanserin Tartrate 34 MG CAPS Take 1 capsule by mouth daily.     sennosides-docusate sodium (SENOKOT-S) 8.6-50 MG tablet Take 1 tablet by mouth 2 (two) times daily.     silver sulfADIAZINE (SILVADENE) 1 % cream Apply 1 Application topically daily.     tamsulosin (FLOMAX) 0.4 MG CAPS capsule TAKE ONE CAPSULE BY MOUTH  EVERY DAY - INCREASED DOSE JUNE 2018     traZODone (DESYREL) 50 MG tablet TAKE 1-2 TABLETS BY MOUTH AT BEDTIME (FOR SLEEP AND MOOD)     venlafaxine XR (EFFEXOR-XR) 150 MG 24 hr capsule TAKE ONE CAPSULE BY MOUTH AT BEDTIME FOR ANXIETY, MOOD, AND POSTTRAUMATIC STRESS DISORDER     Wound Cleansers (SKINTEGRITY WOUND) LIQD Apply topically as needed.     clonazePAM (KLONOPIN) 1 MG tablet TAKE ONE AND ONE-HALF TABLETS BY MOUTH AT BEDTIME REM SLEEP BEHAVIOR DISORDER (Patient not taking: Reported on 04/21/2023)     cyanocobalamin 1000 MCG tablet TAKE ONE TABLET BY MOUTH EVERY DAY FOR GI HEALTH (Patient not taking: Reported on 04/21/2023)     diltiazem (CARDIZEM) 30 MG tablet Take 30 mg by mouth 4 (four) times daily. (Patient not taking: Reported on 04/21/2023)     lisinopril (ZESTRIL) 10 MG tablet Take 1 tablet by mouth daily. (Patient not taking: Reported on 04/21/2023)     mirabegron ER (MYRBETRIQ) 50 MG TB24 tablet Take 1 tablet by mouth daily. (Patient not taking: Reported on 04/21/2023)     zinc sulfate 220 (50 Zn) MG capsule TAKE ONE CAPSULE BY MOUTH EVERY DAY PT REQUEST FOR IMMUNE SUPPORT (Patient not taking: Reported on 04/21/2023)     No current facility-administered medications on file prior to visit.    There are no Patient Instructions on file for this visit. No follow-ups on file.   Georgiana Spinner, NP

## 2023-08-30 ENCOUNTER — Ambulatory Visit (INDEPENDENT_AMBULATORY_CARE_PROVIDER_SITE_OTHER): Payer: No Typology Code available for payment source | Admitting: Nurse Practitioner

## 2023-08-30 ENCOUNTER — Ambulatory Visit (INDEPENDENT_AMBULATORY_CARE_PROVIDER_SITE_OTHER): Payer: No Typology Code available for payment source

## 2023-08-30 ENCOUNTER — Encounter (INDEPENDENT_AMBULATORY_CARE_PROVIDER_SITE_OTHER): Payer: Self-pay | Admitting: Nurse Practitioner

## 2023-08-30 ENCOUNTER — Other Ambulatory Visit (INDEPENDENT_AMBULATORY_CARE_PROVIDER_SITE_OTHER): Payer: Self-pay | Admitting: Nurse Practitioner

## 2023-08-30 ENCOUNTER — Telehealth (INDEPENDENT_AMBULATORY_CARE_PROVIDER_SITE_OTHER): Payer: Self-pay

## 2023-08-30 VITALS — BP 168/83 | HR 77 | Resp 16

## 2023-08-30 DIAGNOSIS — G20A1 Parkinson's disease without dyskinesia, without mention of fluctuations: Secondary | ICD-10-CM

## 2023-08-30 DIAGNOSIS — M79605 Pain in left leg: Secondary | ICD-10-CM | POA: Diagnosis not present

## 2023-08-30 DIAGNOSIS — E785 Hyperlipidemia, unspecified: Secondary | ICD-10-CM | POA: Diagnosis not present

## 2023-08-30 DIAGNOSIS — I8002 Phlebitis and thrombophlebitis of superficial vessels of left lower extremity: Secondary | ICD-10-CM

## 2023-08-30 MED ORDER — APIXABAN 5 MG PO TABS
5.0000 mg | ORAL_TABLET | Freq: Two times a day (BID) | ORAL | 4 refills | Status: AC
Start: 2023-08-30 — End: ?

## 2023-08-30 NOTE — Telephone Encounter (Signed)
Usually you wont see a spot on the leg as a sign of Dvt but he can come in for an ultrasound only and if it is posititve for DVT we can work him in but if not he will need to see his PCP for work up

## 2023-08-30 NOTE — Telephone Encounter (Signed)
Pts wife calls stating that there is a spot mid thigh on left leg and it is pink and swore it has been there since yesterday.  She thinks it may be another blood clot.  This area is also warm to the touch She has spoken with his PCP and they tod her to call us to see if he can be seen.

## 2023-08-30 NOTE — Telephone Encounter (Signed)
Pt is scheduled for 1pm today and wife states understanding

## 2023-09-10 ENCOUNTER — Encounter (INDEPENDENT_AMBULATORY_CARE_PROVIDER_SITE_OTHER): Payer: Self-pay | Admitting: Nurse Practitioner

## 2023-09-10 NOTE — Progress Notes (Signed)
Subjective:    Patient ID: Rodney Richmond, male    DOB: 1945/12/12, 77 y.o.   MRN: [id number] Chief Complaint  Patient presents with   Follow-up    Ultrasound follow up    Rodney Richmond is a 77 year old male who presents today for evaluation following a left lower extremity superficial thrombophlebitis.  Patient's wife noticed a reddened spot on his thigh area which happened when he previously had superficial bites in the area.  He was previously managed on Eliquis 2.5 mg daily for treatment of his superficial phlebitis.  He currently has Parkinson's disease and is not extremely active but he does note that he currently works with physical therapy.  At previous studies his superficial phlebitis had completely resolved.  Following discussion with patient and wife he was transition to a daily baby aspirin at that time.  Today noninvasive studies show recurrence of superficial phlebitis in the great saphenous vein.  Additionally there is an accessory saphenous vein which is also thrombosed and branches into the symptomatic area of redness that was visualized.      Review of Systems  Cardiovascular:  Positive for leg swelling.  Skin:  Positive for color change.  All other systems reviewed and are negative.      Objective:   Physical Exam Vitals reviewed.  HENT:     Head: Normocephalic.  Cardiovascular:     Rate and Rhythm: Normal rate.     Pulses: Normal pulses.  Pulmonary:     Effort: Pulmonary effort is normal.  Skin:    General: Skin is warm and dry.  Neurological:     Mental Status: He is alert and oriented to person, place, and time.     Motor: Weakness present.     Gait: Gait abnormal.  Psychiatric:        Mood and Affect: Mood normal.        Behavior: Behavior normal.        Thought Content: Thought content normal.        Judgment: Judgment normal.     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)     Social History   Socioeconomic History   Marital status: Unknown    Spouse name: Not on file   Number of children: Not on file   Years of education: Not on file   Highest education level: Not on file  Occupational History   Not on file  Tobacco Use   Smoking status: Former    Types: Cigarettes   Smokeless tobacco: Never  Vaping Use   Vaping status: Never Used  Substance and Sexual Activity   Alcohol use: Never   Drug use: Never   Sexual activity: Not on file  Other Topics Concern   Not on file  Social History Narrative   Not on file   Social Determinants of Health   Financial Resource Strain: Low Risk  (01/26/2023)   Received from [facility], [facility]   Overall Financial Resource Strain (CARDIA)    Difficulty of Paying Living Expenses: Not hard at all  Food Insecurity: No Food Insecurity (01/26/2023)   Received from [facility], [facility]   Hunger Vital Sign  Worried About Programme researcher, broadcasting/film/video in the Last Year: Never true    Ran Out of Food in the Last Year: Never true  Transportation Needs: No Transportation Needs (01/26/2023)   Received from [facility], [facility]   [facility] - Transportation    Lack of Transportation (Medical): No    Lack of Transportation (Non-Medical): No  Physical Activity: Not on file  Stress: Not on file  Social Connections: Not on file  Intimate Partner Violence: Not on file    Past Surgical History:  Procedure Laterality Date   APPENDECTOMY     NASAL SINUS SURGERY      Family History  Problem Relation Age of Onset   Varicose Veins Mother    Hypertension Mother    Cancer Mother    Cancer Father    Heart attack Father     Allergies  Allergen Reactions   Amlodipine Swelling   Hydrochlorothiazide Other (See Comments)    Other  Reaction(s): Low blood pressure   Terazosin     Other Reaction(s): Dizziness, Syncope  Other Reaction(s): Syncope       Latest Ref Rng & Units 01/14/2022   11:39 AM 07/28/2013    2:33 AM  CBC  WBC 4.0 - 10.5 K/uL 4.2  9.5   Hemoglobin 13.0 - 17.0 g/dL 16.1  09.6   Hematocrit 39.0 - 52.0 % 40.7  42.4   Platelets 150 - 400 K/uL 184  212       CMP     Component Value Date/Time   NA 142 01/14/2022 1139   NA 140 07/28/2013 0233   K 4.6 01/14/2022 1139   K 4.0 07/28/2013 0233   CL 104 01/14/2022 1139   CL 108 (H) 07/28/2013 0233   CO2 31 01/14/2022 1139   CO2 29 07/28/2013 0233   GLUCOSE 119 (H) 01/14/2022 1139   GLUCOSE 111 (H) 07/28/2013 0233   BUN 18 01/14/2022 1139   BUN 28 (H) 07/28/2013 0233   CREATININE 0.96 01/14/2022 1139   CREATININE 1.61 (H) 07/28/2013 0233   CALCIUM 9.0 01/14/2022 1139   CALCIUM 8.4 (L) 07/28/2013 0233   PROT 6.2 (L) 07/28/2013 0233   ALBUMIN 3.4 07/28/2013 0233   AST 17 07/28/2013 0233   ALT 26 07/28/2013 0233   ALKPHOS 100 07/28/2013 0233   BILITOT 0.3 07/28/2013 0233   GFRNONAA >60 01/14/2022 1139   GFRNONAA 44 (L) 07/28/2013 0233     No results found.     Assessment & Plan:   1. Superficial phlebitis and thrombophlebitis of left lower extremity I had a long discussion with the wife in regards to his treatment plan.  He did well on Eliquis 2.5 mg however within just a short amount of time after stopping it he developed recurrence of the superficial thrombophlebitis.  There is no DVT plan it is approaching close enough to his deep venous system.  Because of this I am going to recommend reinitiation of Eliquis at the full dose.  The patient's Parkinson's may be playing a role there is also some concern for possible hypercoagulability.  Will have the patient returning on Eliquis likely on full dose for minimum of 3 months with return to 2.5 mg of Eliquis.  At the return of 2.5 mg of Eliquis it is the recommendation that he will remain on  this for lifetime.  Will plan on having patient return in 3 weeks to reevaluate from his progression - apixaban (ELIQUIS) 5 MG TABS tablet; Take  1 tablet (5 mg total) by mouth 2 (two) times daily.  Dispense: 64 tablet; Refill: 4  2. Hyperlipidemia, unspecified hyperlipidemia type Continue statin as ordered and reviewed, no changes at this time  3. Parkinson's disease, unspecified whether dyskinesia present, unspecified whether manifestations fluctuate (HCC) Difficulties associated with Parkinson's to increase the risk for recurrent superficial phlebitis   Current Outpatient Medications on File Prior to Visit  Medication Sig Dispense Refill   ACIDOPHILUS LACTOBACILLUS PO Take 1 tablet by mouth daily.     aspirin EC 81 MG tablet Take 81 mg by mouth daily. Swallow whole.     atorvastatin (LIPITOR) 20 MG tablet Take 20 mg by mouth daily.     bacitracin (BACITRAYCIN PLUS) 500 UNIT/GM ointment Apply 1 Application topically as needed for wound care.     buPROPion (WELLBUTRIN) 100 MG tablet Take 100 mg by mouth 2 (two) times daily.     carbamide peroxide (DEBROX) 6.5 % OTIC solution Place 5 drops into both ears 2 (two) times daily. 28 days     carbidopa-levodopa (SINEMET IR) 25-100 MG tablet Take 2 tablets by mouth 4 (four) times daily.     Cholecalciferol 25 MCG (1000 UT) tablet TAKE THREE TABLETS BY MOUTH EVERY DAY FOR BONE STRENGTH, IMMUNE RESPONSE, NERVOUS SYSTEM  REPLACES "CALCIUM 600MG /VITAMIN D400UNITS" FOR BONE STRENGTH, IMMUNE RESPONSE, NERVOUS SYSTEM  REPLACES "CALCIUM 600MG /VITAMIN D400UNITS"     finasteride (PROSCAR) 5 MG tablet TAKE ONE TABLET BY MOUTH ONCE EVERY DAY FOR PROSTATE     fluticasone (FLONASE) 50 MCG/ACT nasal spray Place 2 sprays into both nostrils daily.     folic acid (FOLVITE) 1 MG tablet Take 1 tablet by mouth daily.     gabapentin (NEURONTIN) 100 MG capsule Take 100 mg by mouth 2 (two) times daily.     Melatonin 5 MG CAPS Take 1 capsule by mouth at bedtime.      methotrexate (RHEUMATREX) 2.5 MG tablet TAKE EIGHT TABLETS BY MOUTH ONCE EACH WEEK - TAKE ON AN EMPTY STOMACH ONCE A WEEK THE SAME DAY OF THE WEEK     metoprolol tartrate (LOPRESSOR) 25 MG tablet Take 12.5 mg by mouth daily.     Nutritional Supplements (ENSURE ORIGINAL) LIQD Take by mouth daily.     Pimavanserin Tartrate 34 MG CAPS Take 1 capsule by mouth daily.     sennosides-docusate sodium (SENOKOT-S) 8.6-50 MG tablet Take 1 tablet by mouth 2 (two) times daily.     silver sulfADIAZINE (SILVADENE) 1 % cream Apply 1 Application topically daily.     tamsulosin (FLOMAX) 0.4 MG CAPS capsule TAKE ONE CAPSULE BY MOUTH EVERY DAY - INCREASED DOSE JUNE 2018     traZODone (DESYREL) 50 MG tablet TAKE 1-2 TABLETS BY MOUTH AT BEDTIME (FOR SLEEP AND MOOD)     venlafaxine XR (EFFEXOR-XR) 150 MG 24 hr capsule TAKE ONE CAPSULE BY MOUTH AT BEDTIME FOR ANXIETY, MOOD, AND POSTTRAUMATIC STRESS DISORDER     Wound Cleansers (SKINTEGRITY WOUND) LIQD Apply topically as needed.     clonazePAM (KLONOPIN) 1 MG tablet TAKE ONE AND ONE-HALF TABLETS BY MOUTH AT BEDTIME REM SLEEP BEHAVIOR DISORDER (Patient not taking: Reported on 04/21/2023)     cyanocobalamin 1000 MCG tablet TAKE ONE TABLET BY MOUTH EVERY DAY FOR GI HEALTH (Patient not taking: Reported on 04/21/2023)     diltiazem (CARDIZEM) 30 MG tablet Take 30 mg by mouth 4 (four) times daily. (Patient not taking: Reported on 04/21/2023)     lisinopril (ZESTRIL) 10 MG tablet Take  1 tablet by mouth daily. (Patient not taking: Reported on 04/21/2023)     mirabegron ER (MYRBETRIQ) 50 MG TB24 tablet Take 1 tablet by mouth daily. (Patient not taking: Reported on 04/21/2023)     zinc sulfate 220 (50 Zn) MG capsule TAKE ONE CAPSULE BY MOUTH EVERY DAY PT REQUEST FOR IMMUNE SUPPORT (Patient not taking: Reported on 04/21/2023)     No current facility-administered medications on file prior to visit.    There are no Patient Instructions on file for this visit. No follow-ups on  file.   Georgiana Spinner, NP

## 2023-09-11 ENCOUNTER — Telehealth (INDEPENDENT_AMBULATORY_CARE_PROVIDER_SITE_OTHER): Payer: Self-pay

## 2023-09-11 NOTE — Telephone Encounter (Signed)
Patient spouse left a message asking if patient should wear compression socks. Patient was last seen in the office 08/30/2023.Please Advise

## 2023-09-11 NOTE — Telephone Encounter (Signed)
Yes he should.

## 2023-09-11 NOTE — Telephone Encounter (Signed)
Patient spouse notified with medical recommendations and verbalized understanding

## 2023-09-12 DIAGNOSIS — D485 Neoplasm of uncertain behavior of skin: Secondary | ICD-10-CM | POA: Diagnosis not present

## 2023-09-12 DIAGNOSIS — D045 Carcinoma in situ of skin of trunk: Secondary | ICD-10-CM | POA: Diagnosis not present

## 2023-09-12 DIAGNOSIS — L570 Actinic keratosis: Secondary | ICD-10-CM | POA: Diagnosis not present

## 2023-10-04 DIAGNOSIS — J3489 Other specified disorders of nose and nasal sinuses: Secondary | ICD-10-CM | POA: Diagnosis not present

## 2023-10-04 DIAGNOSIS — M3130 Wegener's granulomatosis without renal involvement: Secondary | ICD-10-CM | POA: Diagnosis not present

## 2023-10-04 DIAGNOSIS — J310 Chronic rhinitis: Secondary | ICD-10-CM | POA: Diagnosis not present

## 2023-10-26 DIAGNOSIS — D045 Carcinoma in situ of skin of trunk: Secondary | ICD-10-CM | POA: Diagnosis not present

## 2023-10-26 DIAGNOSIS — C44529 Squamous cell carcinoma of skin of other part of trunk: Secondary | ICD-10-CM | POA: Diagnosis not present

## 2023-11-05 DIAGNOSIS — Z888 Allergy status to other drugs, medicaments and biological substances status: Secondary | ICD-10-CM | POA: Diagnosis not present

## 2023-11-05 DIAGNOSIS — G20A1 Parkinson's disease without dyskinesia, without mention of fluctuations: Secondary | ICD-10-CM | POA: Diagnosis not present

## 2023-11-05 DIAGNOSIS — R0789 Other chest pain: Secondary | ICD-10-CM | POA: Diagnosis not present

## 2023-11-05 DIAGNOSIS — I2699 Other pulmonary embolism without acute cor pulmonale: Secondary | ICD-10-CM | POA: Diagnosis not present

## 2023-11-05 DIAGNOSIS — R079 Chest pain, unspecified: Secondary | ICD-10-CM | POA: Diagnosis not present

## 2023-11-05 DIAGNOSIS — I10 Essential (primary) hypertension: Secondary | ICD-10-CM | POA: Diagnosis not present

## 2023-11-05 DIAGNOSIS — E785 Hyperlipidemia, unspecified: Secondary | ICD-10-CM | POA: Diagnosis not present

## 2023-11-05 DIAGNOSIS — F4310 Post-traumatic stress disorder, unspecified: Secondary | ICD-10-CM | POA: Diagnosis not present

## 2023-11-25 IMAGING — CT CT CERVICAL SPINE W/O CM
3 of 4 series · 11 of 33 positions shown, 13 images · non-contrast
Comparison: None.

CLINICAL DATA: Neck trauma



[Series 6: sagittal bone · sagittal · 0.23mm/px · 5 of 73 slices shown, 6 images]
[im 25/73  bone]
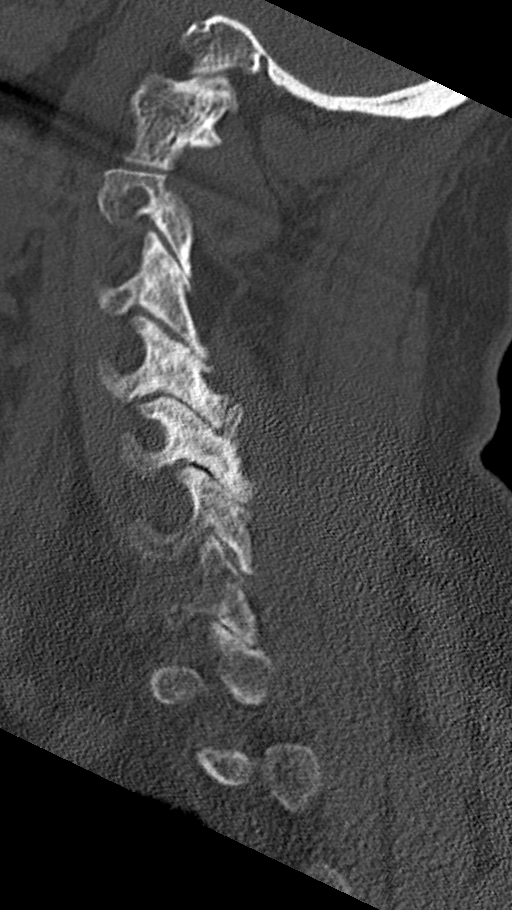
[im 31/73  bone]
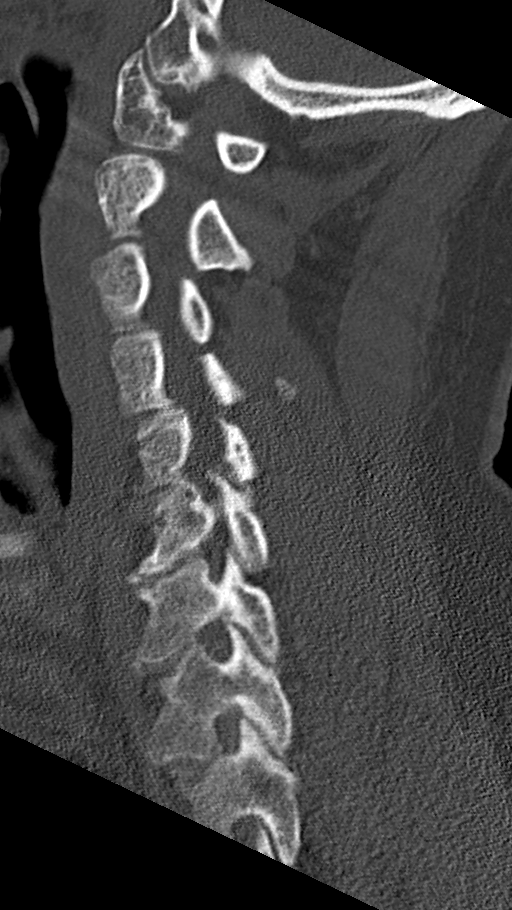
[im 37/73  soft-tissue]
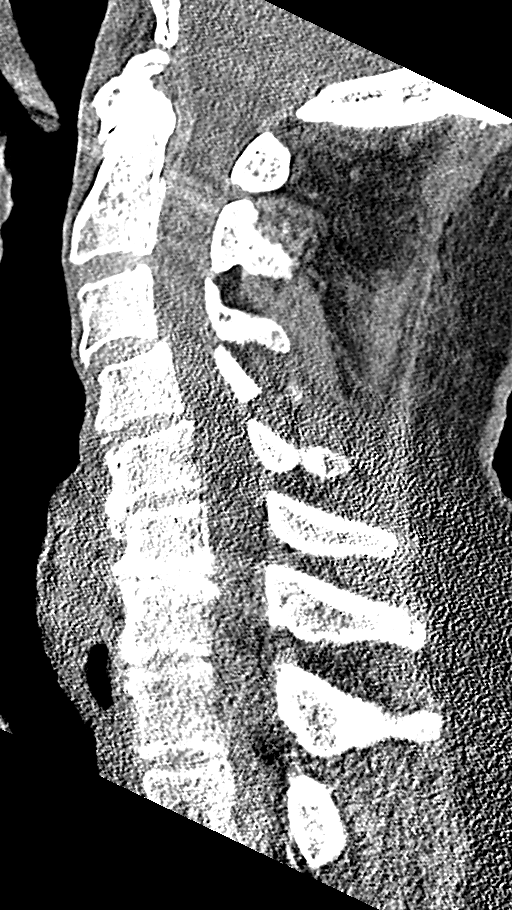
[im 37/73  bone]
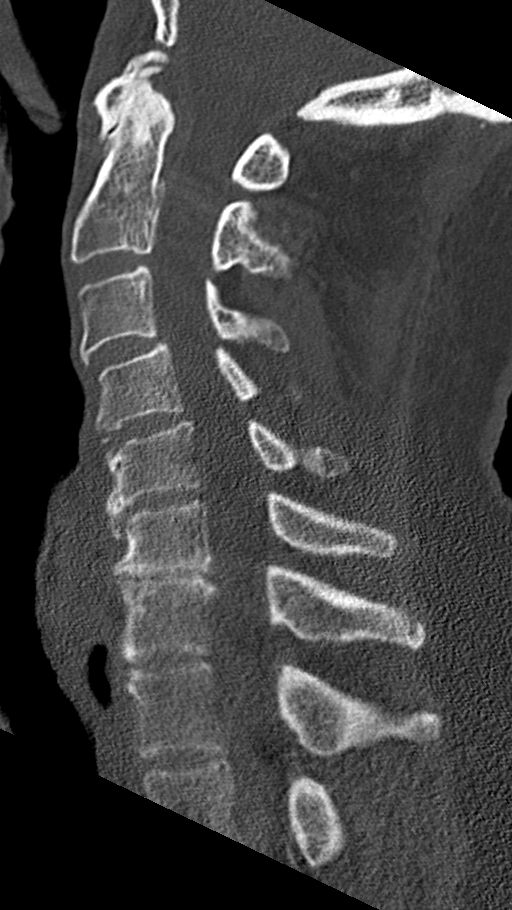
[im 43/73  bone]
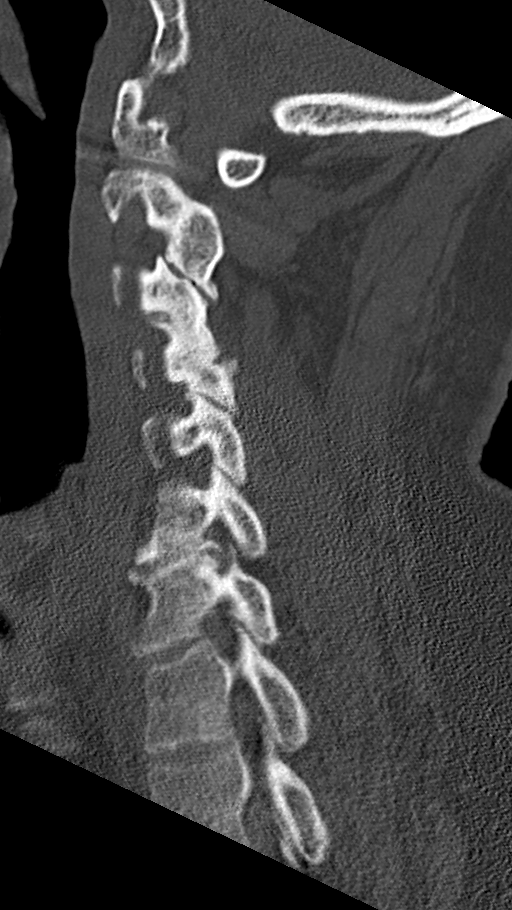
[im 49/73  bone]
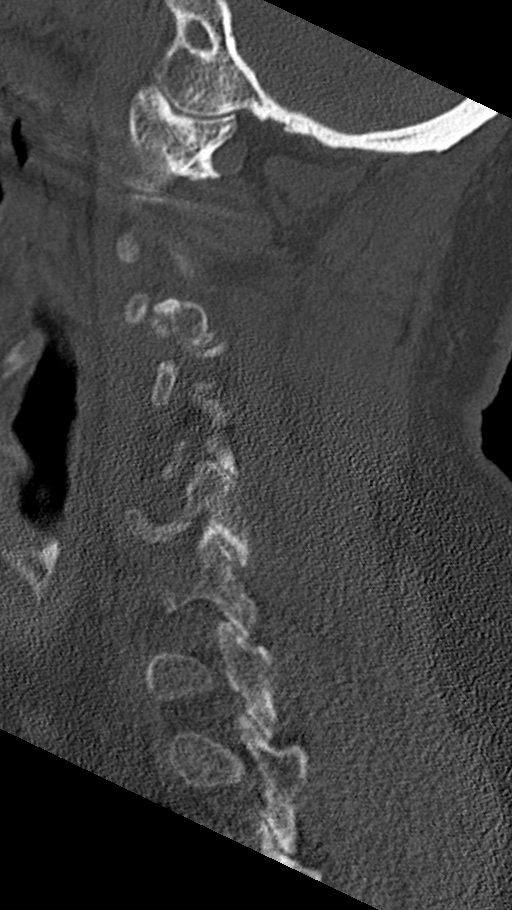

[Series 7: coronal bone · coronal · 0.28mm/px · 3 of 59 slices shown]
[im 13/59  bone]
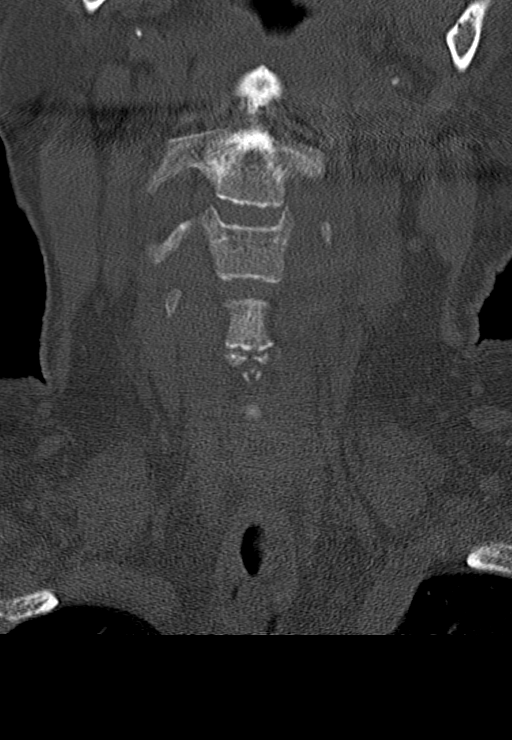
[im 24/59  bone]
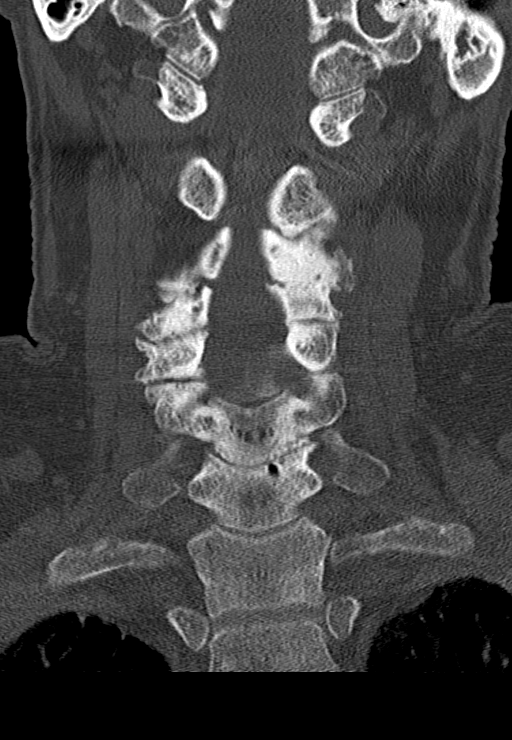
[im 35/59  bone]
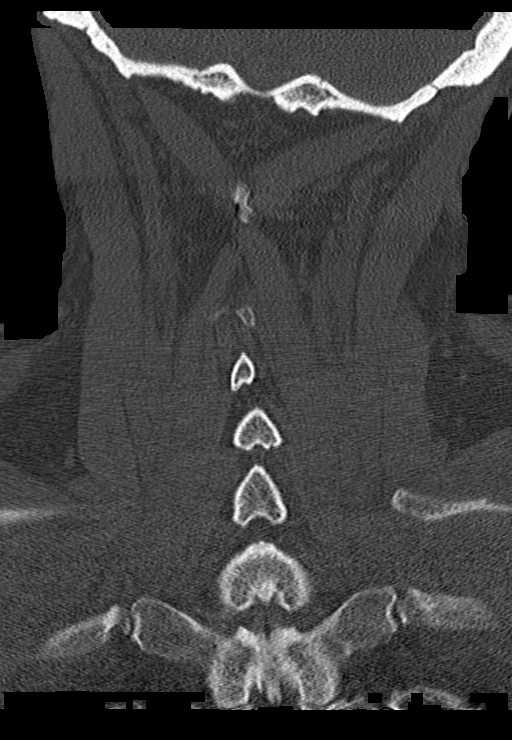

[Series 8: orthogonal bone · axial · 0.23mm/px · z∈[-260,-151]mm · 3 of 105 slices shown, 4 images]
[im 30/105  soft-tissue]
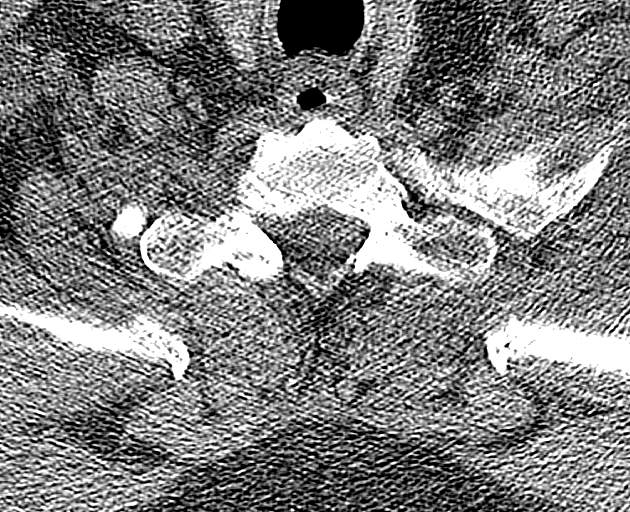
[im 30/105  bone]
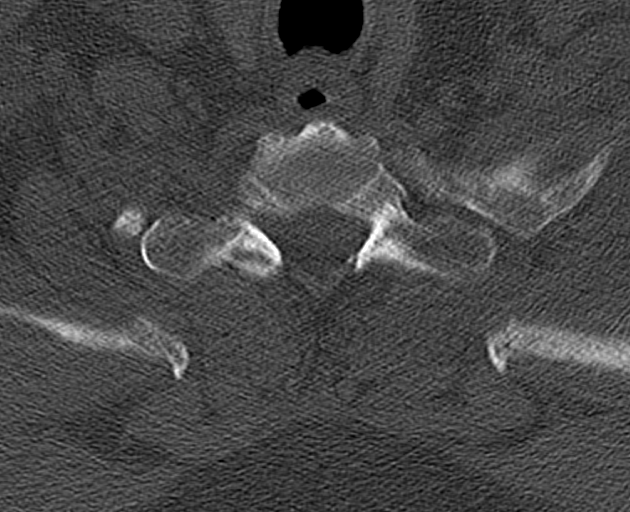
[im 60/105  bone]
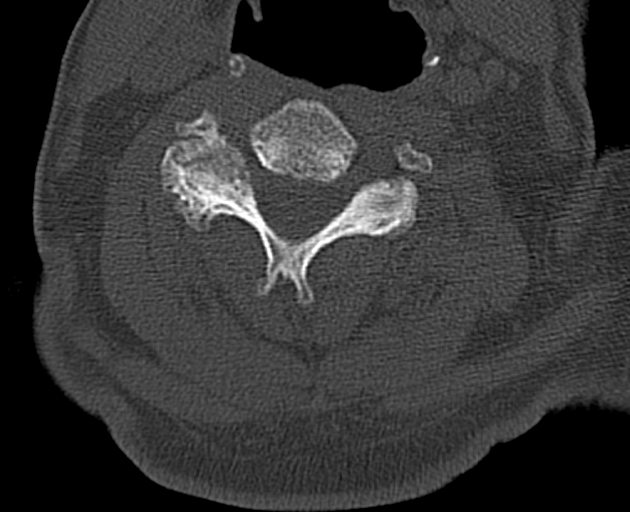
[im 90/105  bone]
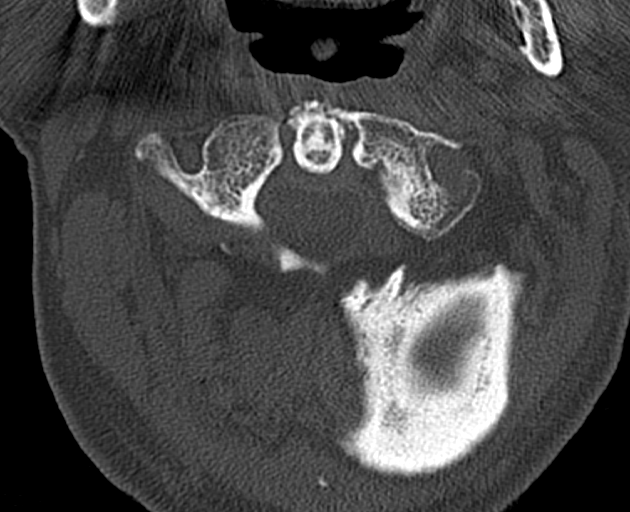

[11 of 33 positions shown; findings below may reference images not displayed]

FINDINGS: Alignment: Facet joints are aligned without dislocation or traumatic
listhesis. Dens and lateral masses are aligned. Degenerative
facet-mediated grade 1 anterolisthesis of C3 on C4 and C4 on C5.

Skull base and vertebrae: No acute fracture. No primary bone lesion
or focal pathologic process.

Soft tissues and spinal canal: No prevertebral fluid or swelling. No
visible canal hematoma.

Disc levels: Degenerative disc disease most pronounced at the C6-7
level. Advanced multilevel facet arthropathy most pronounced on the
left at C3-4 and on the right at C5-6.

Upper chest: Included lung apices are clear.

Other: None.
IMPRESSION: 1. No acute fracture or traumatic listhesis of the cervical spine.
2. Multilevel cervical spondylosis.

## 2023-11-25 IMAGING — CT CT HEAD W/O CM
4 series · 16 of 47 positions shown, 18 images · non-contrast
Comparison: 08/10/2011

CLINICAL DATA: Dizziness, fall, LEFT supraorbital laceration and
bleeding, denies loss of consciousness. History hypertension,
Parkinson's disease; EHR indicates history of "benign brain tumor"



[Series 2: head wo · axial · 0.45mm/px · z∈[-93,+27]mm · 7 of 32 slices shown, 9 images]
[im 4/32  brain]
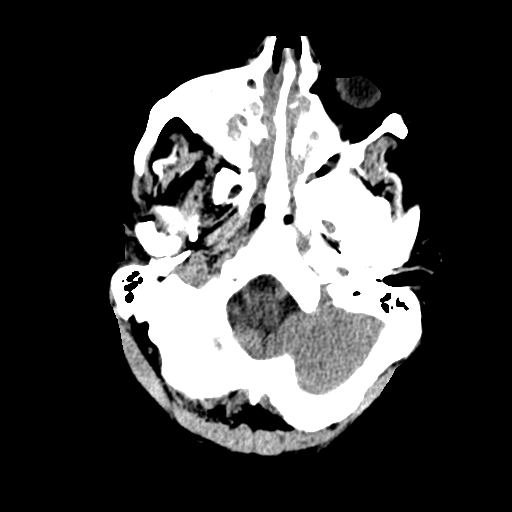
[im 4/32  bone]
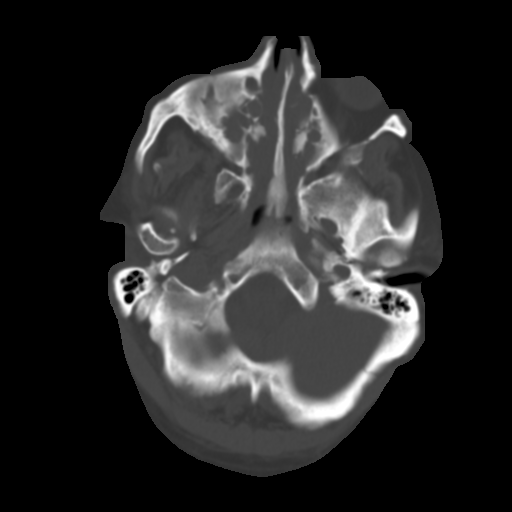
[im 8/32  brain]
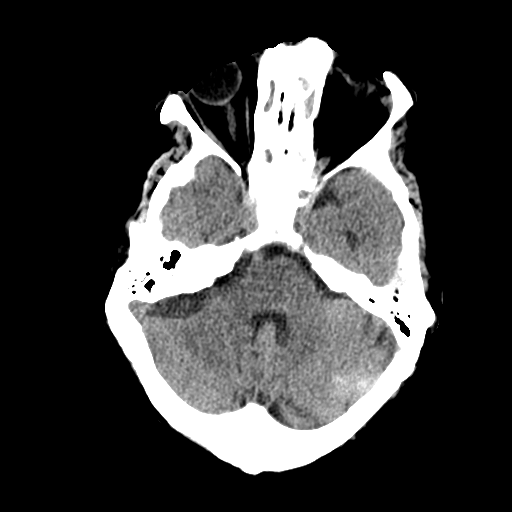
[im 12/32  brain]
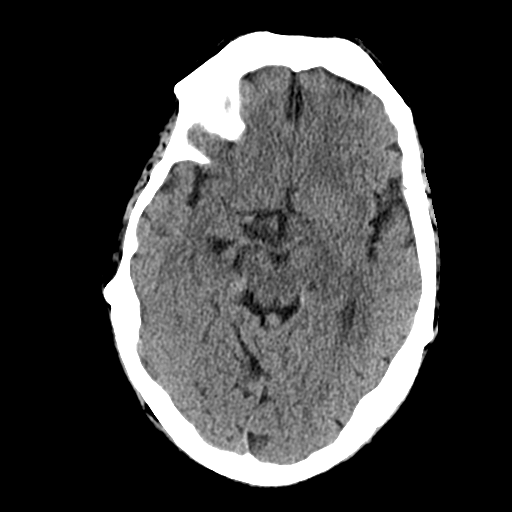
[im 16/32  brain]
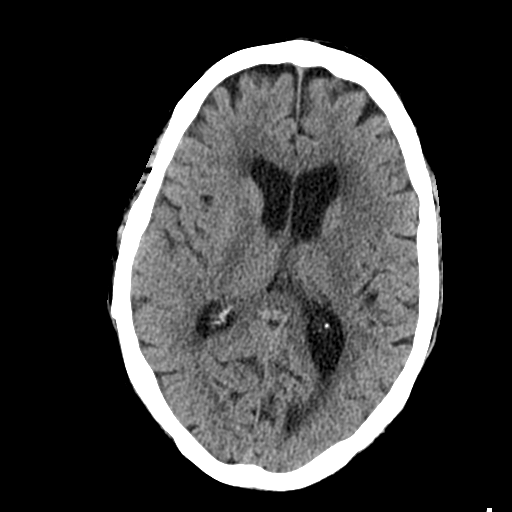
[im 20/32  brain]
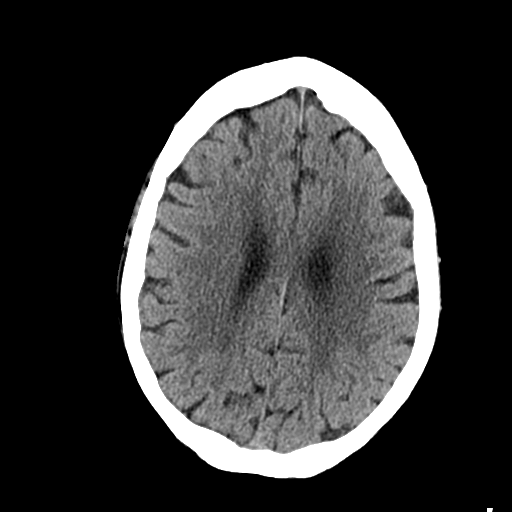
[im 20/32  bone]
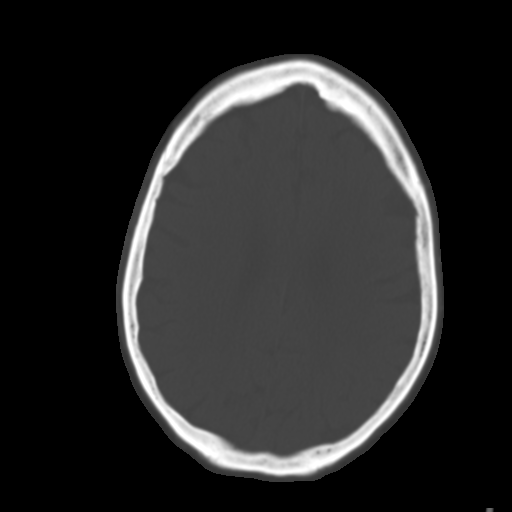
[im 24/32  brain]
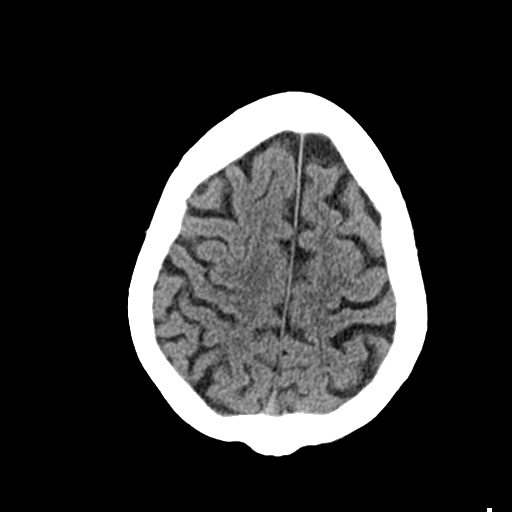
[im 28/32  brain]
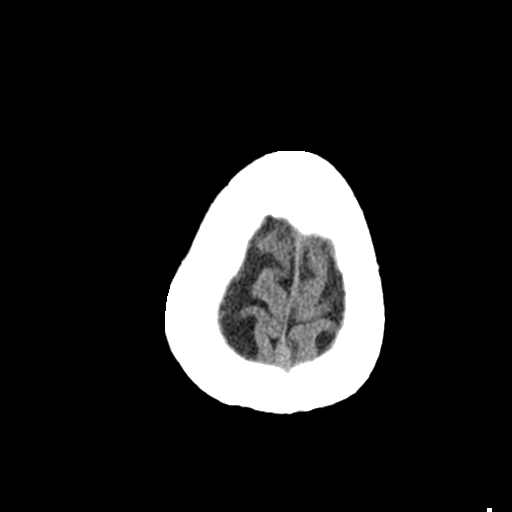

[Series 3: head bone · axial · 0.45mm/px · z∈[-94,-62]mm · 3 of 80 slices shown]
[im 8/80  bone]
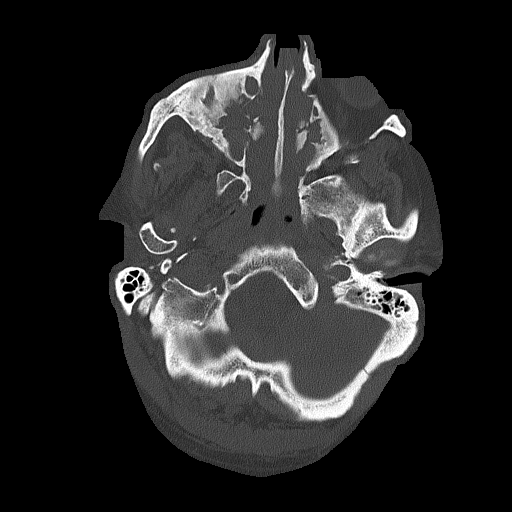
[im 16/80  bone]
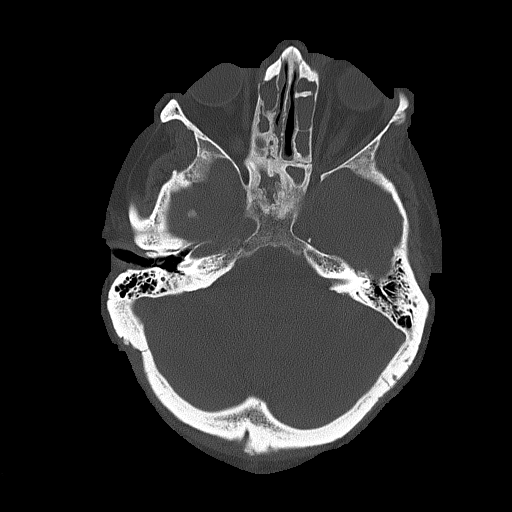
[im 24/80  bone]
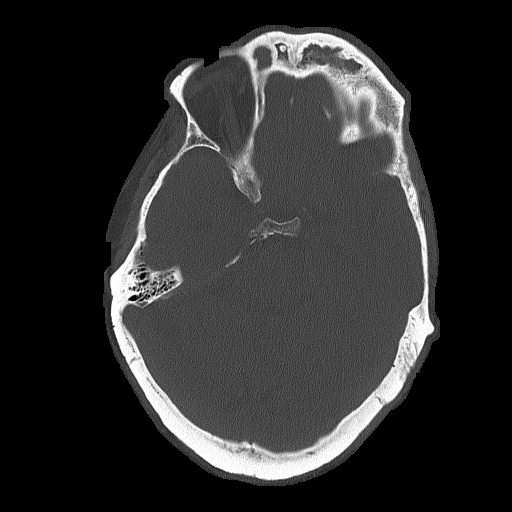

[Series 4: coronal soft tissue · coronal · 0.32mm/px · 3 of 73 slices shown]
[im 25/73  brain]
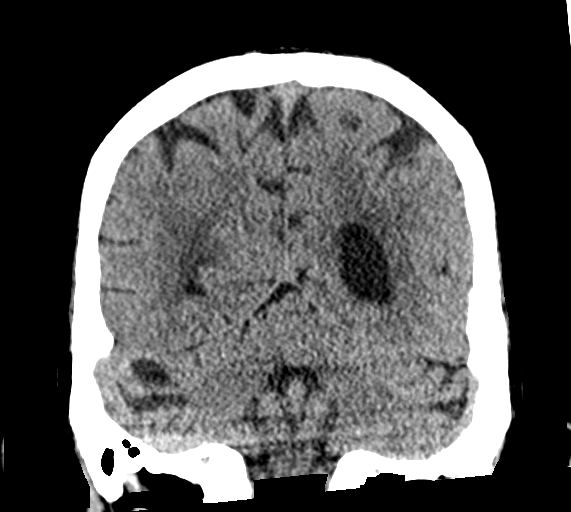
[im 33/73  brain]
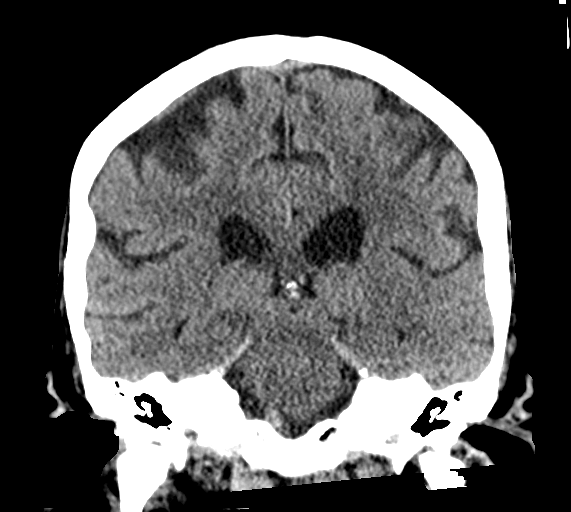
[im 41/73  brain]
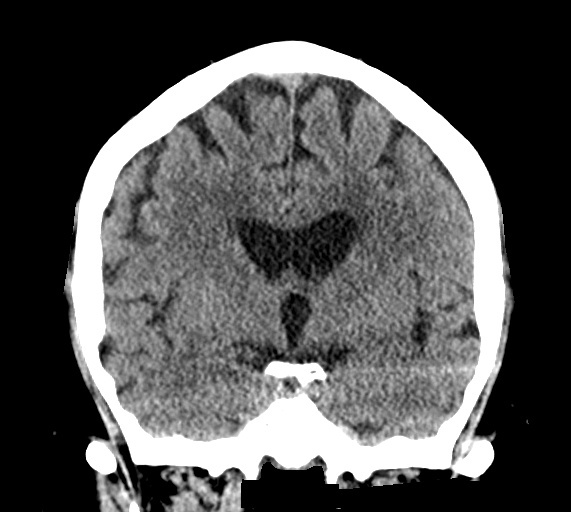

[Series 5: sagittal soft tissue · sagittal · 0.32mm/px · 3 of 62 slices shown]
[im 21/62  brain]
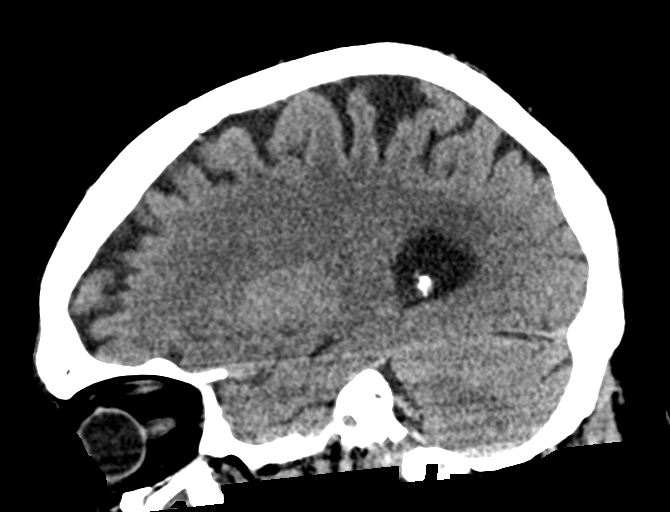
[im 31/62  brain]
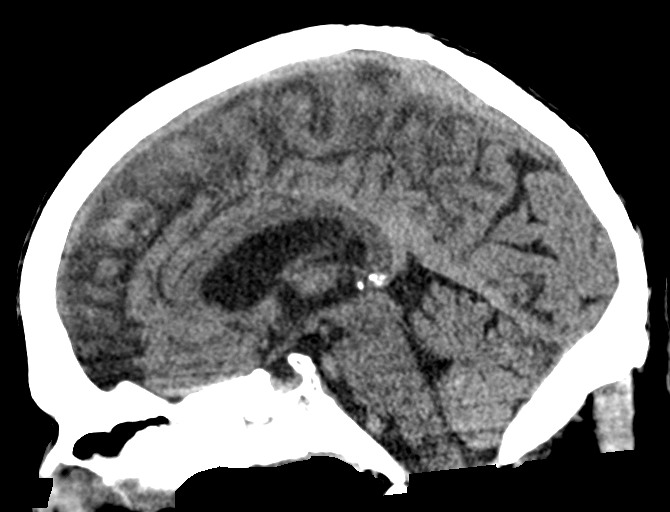
[im 41/62  brain]
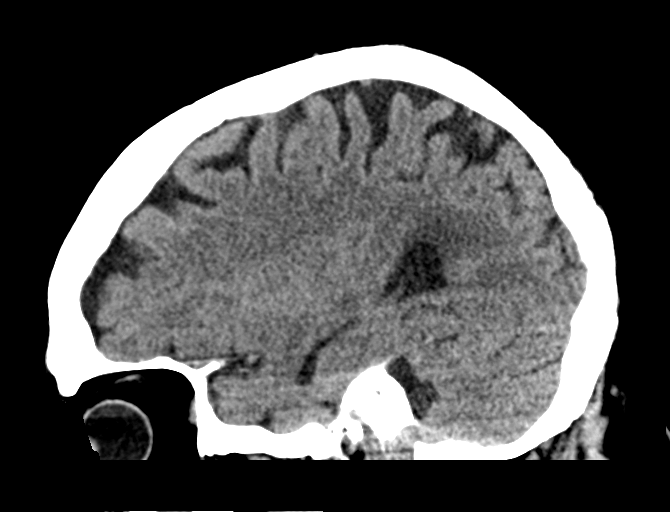

[16 of 47 positions shown; findings below may reference images not displayed]

FINDINGS: Brain: Generalized atrophy. Normal ventricular morphology. No
midline shift or mass effect. Small vessel chronic ischemic changes
of deep cerebral white matter. No intracranial hemorrhage or
evidence of acute infarction. Slightly hyperdense mass identified
peripherally at posterior aspect of LEFT posterior cranial fossa, 26
x 15 x 12 mm, question meningioma. No additional masses or
extra-axial fluid collections.

Vascular: No hyperdense vessels

Skull: Intact

Sinuses/Orbits: Chronic osseous thickening of the walls of the
paranasal sinuses suggesting chronic sinusitis. Mastoid air cells
clear.

Other: N/A
IMPRESSION: Atrophy with small vessel chronic ischemic changes of deep cerebral
white matter.

No acute intracranial abnormalities.

26 x 15 x 12 mm peripherally hyperdense mass at the posterior aspect
of the LEFT posterior cranial fossa, question meningioma, recommend
correlation with patient history; if further imaging is indicated,
MRI brain with and without contrast would be the exam of choice.

Changes of diffuse chronic sinusitis.

## 2023-11-29 ENCOUNTER — Other Ambulatory Visit (INDEPENDENT_AMBULATORY_CARE_PROVIDER_SITE_OTHER): Payer: Self-pay | Admitting: Nurse Practitioner

## 2023-11-29 DIAGNOSIS — I8002 Phlebitis and thrombophlebitis of superficial vessels of left lower extremity: Secondary | ICD-10-CM

## 2023-11-30 ENCOUNTER — Ambulatory Visit (INDEPENDENT_AMBULATORY_CARE_PROVIDER_SITE_OTHER): Payer: Medicare Other | Admitting: Nurse Practitioner

## 2023-11-30 ENCOUNTER — Encounter (INDEPENDENT_AMBULATORY_CARE_PROVIDER_SITE_OTHER): Payer: Self-pay | Admitting: Nurse Practitioner

## 2023-11-30 ENCOUNTER — Ambulatory Visit (INDEPENDENT_AMBULATORY_CARE_PROVIDER_SITE_OTHER): Payer: Medicare Other

## 2023-11-30 VITALS — BP 126/75 | HR 91 | Ht 72.0 in | Wt 180.0 lb

## 2023-11-30 DIAGNOSIS — I8002 Phlebitis and thrombophlebitis of superficial vessels of left lower extremity: Secondary | ICD-10-CM | POA: Diagnosis not present

## 2023-11-30 DIAGNOSIS — G20A1 Parkinson's disease without dyskinesia, without mention of fluctuations: Secondary | ICD-10-CM | POA: Diagnosis not present

## 2023-11-30 DIAGNOSIS — E785 Hyperlipidemia, unspecified: Secondary | ICD-10-CM | POA: Diagnosis not present

## 2023-11-30 NOTE — Progress Notes (Signed)
 Subjective:    Patient ID: Rodney Richmond, male    DOB: Sep 16, 1946, 78 y.o.   MRN: [id number] Chief Complaint  Patient presents with   Follow-up    3 months left DVT    Rodney Richmond is a 78 year old male who presents today for evaluation following a left lower extremity superficial thrombophlebitis.  Patient's wife noticed a reddened spot on his thigh area which happened when he previously had superficial phlebitis in the area.  He was previously managed on Eliquis  2.5 mg daily for treatment of his superficial phlebitis.  He currently has Parkinson's disease and is not extremely active but he does note that he currently works with physical therapy.  He had complete resolution of the initial superficial phlebitis before the second occurred.  There was evidence of phlebitis and the great saphenous vein as well as the accessory saphenous vein as well with branches.  At that time he transition to a baby aspirin.  Today noninvasive studies show complete resolution of the superficial phlebitis in the left lower extremity.  He currently remains on Eliquis  5 mg twice daily    Review of Systems  Cardiovascular:  Positive for leg swelling.  Skin:  Positive for color change.  All other systems reviewed and are negative.      Objective:   Physical Exam Vitals reviewed.  HENT:     Head: Normocephalic.  Cardiovascular:     Rate and Rhythm: Normal rate.     Pulses: Normal pulses.  Pulmonary:     Effort: Pulmonary effort is normal.  Skin:    General: Skin is warm and dry.  Neurological:     Mental Status: He is alert and oriented to person, place, and time.     Motor: Weakness present.     Gait: Gait abnormal.  Psychiatric:        Mood and Affect: Mood normal.        Behavior: Behavior normal.        Thought Content: Thought content normal.        Judgment: Judgment normal.     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)     Social History   Socioeconomic History   Marital status: Unknown    Spouse name: Not on file   Number of children: Not on file   Years of education: Not on file   Highest education level: Not on file  Occupational History   Not on file  Tobacco Use   Smoking status: Former    Types: Cigarettes   Smokeless tobacco: Never  Vaping Use   Vaping status: Never Used  Substance and Sexual Activity   Alcohol use: Never   Drug use: Never   Sexual activity: Not on file  Other Topics Concern   Not on file  Social History Narrative   Not on file   Social Drivers of Health   Financial Resource Strain: Low Risk  (01/26/2023)   Received from [facility], [facility]   Overall Financial Resource Strain (CARDIA)    Difficulty of Paying Living Expenses: Not hard at all  Food Insecurity: No Food Insecurity (01/26/2023)  Received from [facility], [facility]   Hunger Vital Sign    Worried About Running Out of Food in the Last Year: Never true    Ran Out of Food in the Last Year: Never true  Transportation Needs: No Transportation Needs (01/26/2023)   Received from [facility], [facility]   [facility] - Transportation    Lack of Transportation (Medical): No    Lack of Transportation (Non-Medical): No  Physical Activity: Not on file  Stress: Not on file  Social Connections: Not on file  Intimate Partner Violence: Not on file    Past Surgical History:  Procedure Laterality Date   APPENDECTOMY     NASAL SINUS SURGERY      Family History  Problem Relation Age of Onset   Varicose Veins Mother    Hypertension Mother    Cancer Mother    Cancer Father    Heart attack Father     Allergies  Allergen Reactions   Amlodipine Swelling   Hydrochlorothiazide Other (See  Comments)    Other Reaction(s): Low blood pressure   Terazosin     Other Reaction(s): Dizziness, Syncope  Other Reaction(s): Syncope       Latest Ref Rng & Units 01/14/2022   11:39 AM 07/28/2013    2:33 AM  CBC  WBC 4.0 - 10.5 K/uL 4.2  9.5   Hemoglobin 13.0 - 17.0 g/dL 87.3  85.5   Hematocrit 39.0 - 52.0 % 40.7  42.4   Platelets 150 - 400 K/uL 184  212       CMP     Component Value Date/Time   NA 142 01/14/2022 1139   NA 140 07/28/2013 0233   K 4.6 01/14/2022 1139   K 4.0 07/28/2013 0233   CL 104 01/14/2022 1139   CL 108 (H) 07/28/2013 0233   CO2 31 01/14/2022 1139   CO2 29 07/28/2013 0233   GLUCOSE 119 (H) 01/14/2022 1139   GLUCOSE 111 (H) 07/28/2013 0233   BUN 18 01/14/2022 1139   BUN 28 (H) 07/28/2013 0233   CREATININE 0.96 01/14/2022 1139   CREATININE 1.61 (H) 07/28/2013 0233   CALCIUM 9.0 01/14/2022 1139   CALCIUM 8.4 (L) 07/28/2013 0233   PROT 6.2 (L) 07/28/2013 0233   ALBUMIN 3.4 07/28/2013 0233   AST 17 07/28/2013 0233   ALT 26 07/28/2013 0233   ALKPHOS 100 07/28/2013 0233   BILITOT 0.3 07/28/2013 0233   GFRNONAA >60 01/14/2022 1139   GFRNONAA 44 (L) 07/28/2013 0233     No results found.     Assessment & Plan:   1. Superficial phlebitis and thrombophlebitis of left lower extremity I had a long discussion with the wife in regards to his treatment plan.  At this time he has completed full anticoagulation for 3 months.  At this time I recommend he transition back to 2.5 mg of Eliquis .  He does still have 5 mg at home and so I recommend that before this is done he continues and finishes the 5 mg he has at home.  Per the patient's request, they would like to have this filled with her PCP at the [place].  I do recommend that they would remain on 2.5 mg for at least a year but consideration should be given to having him remain on this for lifetime.  Ultimately if they elect to stop 2.5 mg of Eliquis  twice daily after a year, he should transition to baby aspirin  daily.  Due  to the fact that he currently has complete resolution and no further lingering symptoms, we will plan on having the patient follow-up with us  on an as-needed basis.  2. Hyperlipidemia, unspecified hyperlipidemia type Continue statin as ordered and reviewed, no changes at this time  3. Parkinson's disease, unspecified whether dyskinesia present, unspecified whether manifestations fluctuate (HCC) Difficulties associated with Parkinson's to increase the risk for recurrent superficial phlebitis   Current Outpatient Medications on File Prior to Visit  Medication Sig Dispense Refill   ACIDOPHILUS LACTOBACILLUS PO Take 1 tablet by mouth daily.     apixaban  (ELIQUIS ) 5 MG TABS tablet Take 1 tablet (5 mg total) by mouth 2 (two) times daily. 64 tablet 4   aspirin EC 81 MG tablet Take 81 mg by mouth daily. Swallow whole.     atorvastatin (LIPITOR) 20 MG tablet Take 20 mg by mouth daily.     bacitracin (BACITRAYCIN PLUS) 500 UNIT/GM ointment Apply 1 Application topically as needed for wound care.     buPROPion (WELLBUTRIN) 100 MG tablet Take 100 mg by mouth 2 (two) times daily.     carbamide peroxide (DEBROX) 6.5 % OTIC solution Place 5 drops into both ears 2 (two) times daily. 28 days     carbidopa -levodopa  (SINEMET  IR) 25-100 MG tablet Take 2 tablets by mouth 4 (four) times daily.     Cholecalciferol 25 MCG (1000 UT) tablet TAKE THREE TABLETS BY MOUTH EVERY DAY FOR BONE STRENGTH, IMMUNE RESPONSE, NERVOUS SYSTEM  REPLACES CALCIUM 600MG /VITAMIN D400UNITS FOR BONE STRENGTH, IMMUNE RESPONSE, NERVOUS SYSTEM  REPLACES CALCIUM 600MG /VITAMIN D400UNITS     finasteride  (PROSCAR ) 5 MG tablet TAKE ONE TABLET BY MOUTH ONCE EVERY DAY FOR PROSTATE     fluticasone  (FLONASE ) 50 MCG/ACT nasal spray Place 2 sprays into both nostrils daily.     folic acid (FOLVITE) 1 MG tablet Take 1 tablet by mouth daily.     gabapentin  (NEURONTIN ) 100 MG capsule Take 100 mg by mouth 2 (two) times daily.     Melatonin 5  MG CAPS Take 1 capsule by mouth at bedtime.     methotrexate (RHEUMATREX) 2.5 MG tablet TAKE EIGHT TABLETS BY MOUTH ONCE EACH WEEK - TAKE ON AN EMPTY STOMACH ONCE A WEEK THE SAME DAY OF THE WEEK     metoprolol  succinate (TOPROL -XL) 25 MG 24 hr tablet Take 12.5 mg by mouth daily.     metoprolol  tartrate (LOPRESSOR ) 25 MG tablet Take 12.5 mg by mouth daily.     Nutritional Supplements (ENSURE ORIGINAL) LIQD Take by mouth daily.     Pimavanserin  Tartrate 34 MG CAPS Take 1 capsule by mouth daily.     sennosides-docusate sodium  (SENOKOT-S) 8.6-50 MG tablet Take 1 tablet by mouth 2 (two) times daily.     silver sulfADIAZINE (SILVADENE) 1 % cream Apply 1 Application topically daily.     tamsulosin  (FLOMAX ) 0.4 MG CAPS capsule TAKE ONE CAPSULE BY MOUTH EVERY DAY - INCREASED DOSE JUNE 2018     traZODone  (DESYREL ) 50 MG tablet TAKE 1-2 TABLETS BY MOUTH AT BEDTIME (FOR SLEEP AND MOOD)     venlafaxine  XR (EFFEXOR -XR) 150 MG 24 hr capsule TAKE ONE CAPSULE BY MOUTH AT BEDTIME FOR ANXIETY, MOOD, AND POSTTRAUMATIC STRESS DISORDER     Wound Cleansers (SKINTEGRITY WOUND) LIQD Apply topically as needed.     clonazePAM  (KLONOPIN ) 1 MG tablet TAKE ONE AND ONE-HALF TABLETS BY MOUTH AT BEDTIME REM SLEEP BEHAVIOR DISORDER (Patient not taking: Reported on 04/21/2023)     cyanocobalamin  1000  MCG tablet TAKE ONE TABLET BY MOUTH EVERY DAY FOR GI HEALTH (Patient not taking: Reported on 04/21/2023)     diltiazem (CARDIZEM) 30 MG tablet Take 30 mg by mouth 4 (four) times daily. (Patient not taking: Reported on 04/21/2023)     lisinopril (ZESTRIL) 10 MG tablet Take 1 tablet by mouth daily. (Patient not taking: Reported on 11/30/2023)     mirabegron ER (MYRBETRIQ) 50 MG TB24 tablet Take 1 tablet by mouth daily. (Patient not taking: Reported on 04/21/2023)     zinc sulfate 220 (50 Zn) MG capsule TAKE ONE CAPSULE BY MOUTH EVERY DAY PT REQUEST FOR IMMUNE SUPPORT (Patient not taking: Reported on 04/21/2023)     No current  facility-administered medications on file prior to visit.    There are no Patient Instructions on file for this visit. No follow-ups on file.   Merrick Maggio E Betti Goodenow, NP

## 2024-01-31 DIAGNOSIS — M3130 Wegener's granulomatosis without renal involvement: Secondary | ICD-10-CM | POA: Diagnosis not present

## 2024-01-31 DIAGNOSIS — J310 Chronic rhinitis: Secondary | ICD-10-CM | POA: Diagnosis not present

## 2024-01-31 DIAGNOSIS — J3489 Other specified disorders of nose and nasal sinuses: Secondary | ICD-10-CM | POA: Diagnosis not present

## 2024-03-25 DIAGNOSIS — R208 Other disturbances of skin sensation: Secondary | ICD-10-CM | POA: Diagnosis not present

## 2024-03-25 DIAGNOSIS — L821 Other seborrheic keratosis: Secondary | ICD-10-CM | POA: Diagnosis not present

## 2024-05-01 DIAGNOSIS — Z859 Personal history of malignant neoplasm, unspecified: Secondary | ICD-10-CM | POA: Diagnosis not present

## 2024-05-01 DIAGNOSIS — Z872 Personal history of diseases of the skin and subcutaneous tissue: Secondary | ICD-10-CM | POA: Diagnosis not present

## 2024-05-01 DIAGNOSIS — Z86018 Personal history of other benign neoplasm: Secondary | ICD-10-CM | POA: Diagnosis not present

## 2024-05-01 DIAGNOSIS — L578 Other skin changes due to chronic exposure to nonionizing radiation: Secondary | ICD-10-CM | POA: Diagnosis not present

## 2024-06-12 DIAGNOSIS — M3130 Wegener's granulomatosis without renal involvement: Secondary | ICD-10-CM | POA: Diagnosis not present

## 2024-06-12 DIAGNOSIS — Z9889 Other specified postprocedural states: Secondary | ICD-10-CM | POA: Diagnosis not present

## 2024-06-12 DIAGNOSIS — J3489 Other specified disorders of nose and nasal sinuses: Secondary | ICD-10-CM | POA: Diagnosis not present

## 2024-07-29 ENCOUNTER — Encounter: Payer: Self-pay | Admitting: *Deleted

## 2024-07-29 ENCOUNTER — Emergency Department

## 2024-07-29 ENCOUNTER — Inpatient Hospital Stay
Admission: EM | Admit: 2024-07-29 | Discharge: 2024-08-09 | DRG: 177 | Disposition: A | Discharge status: Skilled Nursing Facility | Attending: Family Medicine | Admitting: Family Medicine

## 2024-07-29 ENCOUNTER — Other Ambulatory Visit: Payer: Self-pay

## 2024-07-29 DIAGNOSIS — Z6824 Body mass index (BMI) 24.0-24.9, adult: Secondary | ICD-10-CM | POA: Diagnosis not present

## 2024-07-29 DIAGNOSIS — Z515 Encounter for palliative care: Secondary | ICD-10-CM | POA: Diagnosis not present

## 2024-07-29 DIAGNOSIS — E871 Hypo-osmolality and hyponatremia: Secondary | ICD-10-CM | POA: Diagnosis present

## 2024-07-29 DIAGNOSIS — D84821 Immunodeficiency due to drugs: Secondary | ICD-10-CM | POA: Diagnosis present

## 2024-07-29 DIAGNOSIS — U071 COVID-19: Secondary | ICD-10-CM | POA: Diagnosis present

## 2024-07-29 DIAGNOSIS — G20A1 Parkinson's disease without dyskinesia, without mention of fluctuations: Secondary | ICD-10-CM | POA: Diagnosis present

## 2024-07-29 DIAGNOSIS — R627 Adult failure to thrive: Secondary | ICD-10-CM | POA: Diagnosis present

## 2024-07-29 DIAGNOSIS — R296 Repeated falls: Secondary | ICD-10-CM | POA: Diagnosis present

## 2024-07-29 DIAGNOSIS — Z7982 Long term (current) use of aspirin: Secondary | ICD-10-CM

## 2024-07-29 DIAGNOSIS — L89153 Pressure ulcer of sacral region, stage 3: Secondary | ICD-10-CM | POA: Diagnosis present

## 2024-07-29 DIAGNOSIS — Z8249 Family history of ischemic heart disease and other diseases of the circulatory system: Secondary | ICD-10-CM | POA: Diagnosis not present

## 2024-07-29 DIAGNOSIS — Z87891 Personal history of nicotine dependence: Secondary | ICD-10-CM

## 2024-07-29 DIAGNOSIS — Z888 Allergy status to other drugs, medicaments and biological substances status: Secondary | ICD-10-CM | POA: Diagnosis not present

## 2024-07-29 DIAGNOSIS — Z9181 History of falling: Secondary | ICD-10-CM

## 2024-07-29 DIAGNOSIS — E611 Iron deficiency: Secondary | ICD-10-CM | POA: Diagnosis present

## 2024-07-29 DIAGNOSIS — R630 Anorexia: Secondary | ICD-10-CM | POA: Diagnosis present

## 2024-07-29 DIAGNOSIS — Y92009 Unspecified place in unspecified non-institutional (private) residence as the place of occurrence of the external cause: Secondary | ICD-10-CM

## 2024-07-29 DIAGNOSIS — Z79631 Long term (current) use of antimetabolite agent: Secondary | ICD-10-CM

## 2024-07-29 DIAGNOSIS — W19XXXA Unspecified fall, initial encounter: Secondary | ICD-10-CM | POA: Diagnosis not present

## 2024-07-29 DIAGNOSIS — Z79899 Other long term (current) drug therapy: Secondary | ICD-10-CM | POA: Diagnosis not present

## 2024-07-29 DIAGNOSIS — F419 Anxiety disorder, unspecified: Secondary | ICD-10-CM | POA: Diagnosis present

## 2024-07-29 DIAGNOSIS — E860 Dehydration: Secondary | ICD-10-CM | POA: Diagnosis present

## 2024-07-29 DIAGNOSIS — E785 Hyperlipidemia, unspecified: Secondary | ICD-10-CM | POA: Diagnosis present

## 2024-07-29 DIAGNOSIS — G20C Parkinsonism, unspecified: Secondary | ICD-10-CM | POA: Diagnosis present

## 2024-07-29 DIAGNOSIS — R531 Weakness: Secondary | ICD-10-CM | POA: Diagnosis present

## 2024-07-29 DIAGNOSIS — Z7901 Long term (current) use of anticoagulants: Secondary | ICD-10-CM | POA: Diagnosis not present

## 2024-07-29 DIAGNOSIS — F4310 Post-traumatic stress disorder, unspecified: Secondary | ICD-10-CM | POA: Diagnosis present

## 2024-07-29 DIAGNOSIS — Z86011 Personal history of benign neoplasm of the brain: Secondary | ICD-10-CM

## 2024-07-29 DIAGNOSIS — I10 Essential (primary) hypertension: Secondary | ICD-10-CM | POA: Diagnosis present

## 2024-07-29 DIAGNOSIS — Z7401 Bed confinement status: Secondary | ICD-10-CM | POA: Diagnosis not present

## 2024-07-29 DIAGNOSIS — R509 Fever, unspecified: Secondary | ICD-10-CM | POA: Diagnosis not present

## 2024-07-29 LAB — CBC WITH DIFFERENTIAL/PLATELET
Abs Immature Granulocytes: 0.02 K/uL (ref 0.00–0.07)
Basophils Absolute: 0 K/uL (ref 0.0–0.1)
Basophils Relative: 0 %
Eosinophils Absolute: 0 K/uL (ref 0.0–0.5)
Eosinophils Relative: 1 %
HCT: 34.1 % — ABNORMAL LOW (ref 39.0–52.0)
Hemoglobin: 11.4 g/dL — ABNORMAL LOW (ref 13.0–17.0)
Immature Granulocytes: 1 %
Lymphocytes Relative: 6 %
Lymphs Abs: 0.2 K/uL — ABNORMAL LOW (ref 0.7–4.0)
MCH: 32.8 pg (ref 26.0–34.0)
MCHC: 33.4 g/dL (ref 30.0–36.0)
MCV: 98 fL (ref 80.0–100.0)
Monocytes Absolute: 0.5 K/uL (ref 0.1–1.0)
Monocytes Relative: 13 %
Neutro Abs: 2.8 K/uL (ref 1.7–7.7)
Neutrophils Relative %: 79 %
Platelets: 92 K/uL — ABNORMAL LOW (ref 150–400)
RBC: 3.48 MIL/uL — ABNORMAL LOW (ref 4.22–5.81)
RDW: 14.1 % (ref 11.5–15.5)
WBC: 3.6 K/uL — ABNORMAL LOW (ref 4.0–10.5)
nRBC: 0 % (ref 0.0–0.2)

## 2024-07-29 LAB — RESP PANEL BY RT-PCR (RSV, FLU A&B, COVID)  RVPGX2
Influenza A by PCR: NEGATIVE
Influenza B by PCR: NEGATIVE
Resp Syncytial Virus by PCR: NEGATIVE
SARS Coronavirus 2 by RT PCR: POSITIVE — AB

## 2024-07-29 LAB — COMPREHENSIVE METABOLIC PANEL WITH GFR
ALT: <5 U/L (ref 0–44)
AST: 21 U/L (ref 15–41)
Albumin: 3.1 g/dL — ABNORMAL LOW (ref 3.5–5.0)
Alkaline Phosphatase: 91 U/L (ref 38–126)
Anion gap: 6 (ref 5–15)
BUN: 19 mg/dL (ref 8–23)
CO2: 25 mmol/L (ref 22–32)
Calcium: 7.9 mg/dL — ABNORMAL LOW (ref 8.9–10.3)
Chloride: 103 mmol/L (ref 98–111)
Creatinine, Ser: 0.71 mg/dL (ref 0.61–1.24)
GFR, Estimated: >60 mL/min (ref >60)
Glucose, Bld: 100 mg/dL — ABNORMAL HIGH (ref 70–99)
Potassium: 4 mmol/L (ref 3.5–5.1)
Sodium: 134 mmol/L — ABNORMAL LOW (ref 135–145)
Total Bilirubin: 1 mg/dL (ref 0.0–1.2)
Total Protein: 5.7 g/dL — ABNORMAL LOW (ref 6.5–8.1)

## 2024-07-29 LAB — PROTIME-INR
INR: 1.5 — ABNORMAL HIGH (ref 0.8–1.2)
Prothrombin Time: 18.6 s — ABNORMAL HIGH (ref 11.4–15.2)

## 2024-07-29 LAB — LACTIC ACID, PLASMA
Lactic Acid, Venous: 1.2 mmol/L (ref 0.5–1.9)
Lactic Acid, Venous: 1.2 mmol/L (ref 0.5–1.9)

## 2024-07-29 MED ORDER — ONDANSETRON HCL 4 MG PO TABS: 4.0000 mg | ORAL_TABLET | Freq: Four times a day (QID) | ORAL | Status: DC | PRN

## 2024-07-29 MED ORDER — SODIUM CHLORIDE 0.9 % IV BOLUS
500.0000 mL | Freq: Once | INTRAVENOUS | Status: AC
Start: 2024-07-29 — End: 2024-07-30
  Administered 2024-07-29: 500 mL via INTRAVENOUS

## 2024-07-29 MED ORDER — ENOXAPARIN SODIUM 40 MG/0.4ML IJ SOSY
40.0000 mg | PREFILLED_SYRINGE | INTRAMUSCULAR | Status: DC
  Administered 2024-07-30: 40 mg via SUBCUTANEOUS
  Filled 2024-07-29: qty 0.4

## 2024-07-29 MED ORDER — DEXTROSE IN LACTATED RINGERS 5 % IV SOLN: INTRAVENOUS | Status: AC

## 2024-07-29 MED ORDER — ACETAMINOPHEN 325 MG PO TABS
650.0000 mg | ORAL_TABLET | Freq: Four times a day (QID) | ORAL | Status: DC | PRN
  Filled 2024-07-29: qty 2

## 2024-07-29 MED ORDER — ACETAMINOPHEN 650 MG RE SUPP: 650.0000 mg | Freq: Four times a day (QID) | RECTAL | Status: DC | PRN

## 2024-07-29 MED ORDER — ONDANSETRON HCL 4 MG/2ML IJ SOLN: 4.0000 mg | Freq: Four times a day (QID) | INTRAMUSCULAR | Status: DC | PRN

## 2024-07-29 NOTE — ED Notes (Signed)
 Phlebotomist arrived to collect blood samples

## 2024-07-29 NOTE — ED Triage Notes (Signed)
 Pt brought in via ems from home.  Pt has a fever, weakness and flu exposure.  Hx parkinson's.  Sx began today.  Pt on stretcher in triage.

## 2024-07-29 NOTE — ED Provider Notes (Signed)
 [facility] Provider Note    Event Date/Time   First MD Initiated Contact with Patient 07/29/24 2054     (approximate)   History   Fever   HPI  ZAVION SLEIGHT is a 78 y.o. male who presents to the emergency department today because of concerns for weakness and fever.  Patient has a history of Parkinson's.  History is primarily obtained from wife at bedside.  Normally the patient is able to help out with activities of daily living however today he was much weaker than normal.  The patient had to have EMS come to the house multiple times to help move him and get him off the floor.  This afternoon wife noticed that he started developing a fever.  They had been told that a home aide had tested positive for the flu recently.     Physical Exam   Triage Vital Signs: ED Triage Vitals  Encounter Vitals Group     BP 07/29/24 1901 (!) 149/81     Girls Systolic BP Percentile --      Girls Diastolic BP Percentile --      Boys Systolic BP Percentile --      Boys Diastolic BP Percentile --      Pulse Rate 07/29/24 1901 89     Resp 07/29/24 1901 20     Temp 07/29/24 1901 98.2 F (36.8 C)     Temp Source 07/29/24 1901 Oral     SpO2 07/29/24 1901 97 %     Weight 07/29/24 1858 182 lb (82.6 kg)     Height 07/29/24 1858 6' (1.829 m)     Head Circumference --      Peak Flow --      Pain Score 07/29/24 1858 0     Pain Loc --      Pain Education --      Exclude from Growth Chart --     Most recent vital signs: Vitals:   07/29/24 2028 07/29/24 2037  BP: (!) 136/97   Pulse: 79   Resp: 20   Temp: 98.6 F (37 C)   SpO2: 100% 100%   General: Awake, alert, oriented. CV:  Good peripheral perfusion. Regular rate and rhythm. Resp:  Normal effort. Lungs clear. Abd:  No distention.   ED Results / Procedures / Treatments   Labs (all labs ordered are listed, but only abnormal results are displayed) Labs Reviewed  RESP PANEL BY RT-PCR (RSV, FLU A&B, COVID)   [id number] - Abnormal; Notable for the following components:      Result Value   SARS Coronavirus 2 by RT PCR POSITIVE (*)    All other components within normal limits  COMPREHENSIVE METABOLIC PANEL WITH GFR - Abnormal; Notable for the following components:   Sodium 134 (*)    Glucose, Bld 100 (*)    Calcium 7.9 (*)    Total Protein 5.7 (*)    Albumin 3.1 (*)    All other components within normal limits  CBC WITH DIFFERENTIAL/PLATELET - Abnormal; Notable for the following components:   WBC 3.6 (*)    RBC 3.48 (*)    Hemoglobin 11.4 (*)    HCT 34.1 (*)    Platelets 92 (*)    Lymphs Abs 0.2 (*)    All other components within normal limits  PROTIME-INR - Abnormal; Notable for the following components:   Prothrombin Time 18.6 (*)    INR 1.5 (*)    All other components  within normal limits  CULTURE, BLOOD (ROUTINE X 2)  CULTURE, BLOOD (ROUTINE X 2)  LACTIC ACID, PLASMA  LACTIC ACID, PLASMA  URINALYSIS, W/ REFLEX TO CULTURE (INFECTION SUSPECTED)     EKG  None   RADIOLOGY I independently interpreted and visualized the CXR. My interpretation: No pneumonia Radiology interpretation:  IMPRESSION:  No active cardiopulmonary disease.      PROCEDURES:  Critical Care performed: No   MEDICATIONS ORDERED IN ED: Medications - No data to display   IMPRESSION / MDM / ASSESSMENT AND PLAN / ED COURSE  I reviewed the triage vital signs and the nursing notes.                              Differential diagnosis includes, but is not limited to, influenza, covid, pneumonia  Patient's presentation is most consistent with acute presentation with potential threat to life or bodily function.   Patient presented to the emergency department today because of concerns for weakness and fever.  On exam patient is awake and alert however does appear weak.  Blood work here without significant leukocytosis or electrolyte abnormality.  Patient did test positive for COVID.  I do think this would  explain the patient's symptoms.  Given concern for significant weakness I do not think would be safe for patient to be discharged home at this time.  Will discuss with hospitalist service for admission.     FINAL CLINICAL IMPRESSION(S) / ED DIAGNOSES   Final diagnoses:  COVID-19  Weakness      Note:  This document was prepared using Dragon voice recognition software and may include unintentional dictation errors.    Floy Roberts, MD 07/29/24 2352

## 2024-07-29 NOTE — H&P (Signed)
 History and Physical    Patient: Rodney Richmond FMW:[id number] DOB: 1946/05/07 DOA: 07/29/2024 DOS: the patient was seen and examined on 07/29/2024 PCP: System, Provider Not In  Patient coming from: Home  Chief Complaint:  Chief Complaint  Patient presents with   Fever   HPI: Rodney Richmond is a 78 y.o. male with medical history significant of essential hypertension, Parkinson's disease, PTSD, history of prior brain tumor who was brought in by family secondary to generalized weakness and multiple falls at home.  Patient had at least 3 episode of falling at home today where he slumped on the floor.  EMS was called all 3 times.  Ultimately after the third episode patient was brought to the ER with generalized weakness and fall.  There is associated fever and weakness.  Family believe patient had exposure to the flu.  Wife at bedside.  On arrival his temperature is 101.9 and heart rate 101-105.  He is 98% on room air and blood pressure was stable.  His respiratory rate however was 28.  Patient had workup for infectious sources and so far COVID-19 was positive.  Patient suspected to have acute illness with generalized weakness and fall as a result of COVID-19 infection.  He is not having any respiratory symptoms.  Not hypoxic.  Patient will be admitted for further supportive care due to failure to thrive at home and recurrent falling.  Review of Systems: As mentioned in the history of present illness. All other systems reviewed and are negative. 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 His smoking use included cigarettes. He has never used smokeless tobacco. He reports that he does  not drink alcohol and does not use drugs.  Allergies  Allergen Reactions   Amlodipine Swelling   Hydrochlorothiazide Other (See Comments)    Other Reaction(s): Low blood pressure   Terazosin     Other Reaction(s): Dizziness, Syncope  Other Reaction(s): Syncope    Family History  Problem Relation Age of Onset   Varicose Veins Mother    Hypertension Mother    Cancer Mother    Cancer Father    Heart attack Father     Prior to Admission medications   Medication Sig Start Date End Date Taking? Authorizing Provider  ACIDOPHILUS LACTOBACILLUS PO Take 1 tablet by mouth daily.    [provider]  apixaban  (ELIQUIS ) 5 MG TABS tablet Take 1 tablet (5 mg total) by mouth 2 (two) times daily. 08/30/23   Brown, Fallon E, NP  aspirin EC 81 MG tablet Take 81 mg by mouth daily. Swallow whole.    [provider]  atorvastatin (LIPITOR) 20 MG tablet Take 20 mg by mouth daily.    [provider]  bacitracin (BACITRAYCIN PLUS) 500 UNIT/GM ointment Apply 1 Application topically as needed for wound care.    [provider]  buPROPion (WELLBUTRIN) 100 MG tablet Take 100 mg by mouth 2 (two) times daily.    [provider]  carbamide peroxide (DEBROX) 6.5 % OTIC solution Place 5 drops into both ears 2 (two) times daily. 28 days    [provider]  carbidopa -levodopa  (SINEMET  IR) 25-100 MG tablet Take 2 tablets by mouth 4 (four) times daily.    [provider]  Cholecalciferol 25 MCG (1000 UT) tablet TAKE THREE TABLETS BY MOUTH EVERY DAY FOR BONE STRENGTH, IMMUNE RESPONSE, NERVOUS SYSTEM  REPLACES CALCIUM 600MG /VITAMIN D400UNITS FOR BONE STRENGTH, IMMUNE RESPONSE, NERVOUS SYSTEM  REPLACES CALCIUM 600MG /VITAMIN D400UNITS 06/22/21   [provider]  clonazePAM  (KLONOPIN ) 1 MG tablet TAKE ONE AND ONE-HALF TABLETS BY MOUTH AT BEDTIME REM SLEEP BEHAVIOR DISORDER Patient not taking: Reported on 04/21/2023 10/21/21   [provider]   cyanocobalamin  1000 MCG tablet TAKE ONE TABLET BY MOUTH EVERY DAY FOR GI HEALTH Patient not taking: Reported on 04/21/2023 06/22/21   [provider]  diltiazem (CARDIZEM) 30 MG tablet Take 30 mg by mouth 4 (four) times daily. Patient not taking: Reported on 04/21/2023    [provider]  finasteride  (PROSCAR ) 5 MG tablet TAKE ONE TABLET BY MOUTH ONCE EVERY DAY FOR PROSTATE 01/05/22   [provider]  fluticasone  (FLONASE ) 50 MCG/ACT nasal spray Place 2 sprays into both nostrils daily.    [provider]  folic acid (FOLVITE) 1 MG tablet Take 1 tablet by mouth daily. 06/22/21   [provider]  gabapentin  (NEURONTIN ) 100 MG capsule Take 100 mg by mouth 2 (two) times daily.    [provider]  lisinopril (ZESTRIL) 10 MG tablet Take 1 tablet by mouth daily. Patient not taking: Reported on 11/30/2023 11/18/21   [provider]  Melatonin 5 MG CAPS Take 1 capsule by mouth at bedtime.    [provider]  methotrexate (RHEUMATREX) 2.5 MG tablet TAKE EIGHT TABLETS BY MOUTH ONCE EACH WEEK - TAKE ON AN EMPTY STOMACH ONCE A WEEK THE SAME DAY OF THE WEEK 03/19/21   [provider]  metoprolol  succinate (TOPROL -XL) 25 MG 24 hr tablet Take 12.5 mg by mouth daily. 09/05/23   [provider]  metoprolol  tartrate (LOPRESSOR ) 25 MG tablet Take 12.5 mg by mouth daily. 11/18/21   [provider]  mirabegron ER (MYRBETRIQ) 50 MG TB24 tablet Take 1 tablet by mouth daily. Patient not taking: Reported on 04/21/2023 01/05/22   [provider]  Nutritional Supplements (ENSURE ORIGINAL) LIQD Take by mouth daily.    [provider]  Pimavanserin  Tartrate 34 MG CAPS Take 1 capsule by mouth daily.    [provider]  sennosides-docusate sodium  (SENOKOT-S) 8.6-50 MG tablet Take 1 tablet by mouth 2 (two) times daily.    [provider]  silver sulfADIAZINE (SILVADENE) 1 % cream Apply 1 Application  topically daily.    [provider]  tamsulosin  (FLOMAX ) 0.4 MG CAPS capsule TAKE ONE CAPSULE BY MOUTH EVERY DAY - INCREASED DOSE JUNE 2018 11/18/21   [provider]  traZODone  (DESYREL ) 50 MG tablet TAKE 1-2 TABLETS BY MOUTH AT BEDTIME (FOR SLEEP AND MOOD) 06/21/21   [provider]  venlafaxine  XR (EFFEXOR -XR) 150 MG 24 hr capsule TAKE ONE CAPSULE BY MOUTH AT BEDTIME FOR ANXIETY, MOOD, AND POSTTRAUMATIC STRESS DISORDER 06/21/21   [provider]  Wound Cleansers (SKINTEGRITY WOUND) LIQD Apply topically as needed.    [provider]  zinc sulfate 220 (50 Zn) MG capsule TAKE ONE CAPSULE BY MOUTH EVERY DAY PT REQUEST FOR IMMUNE SUPPORT Patient not taking: Reported on 04/21/2023 09/04/19   [provider]    Physical Exam: Vitals:   07/29/24 1858 07/29/24 1901 07/29/24 2028 07/29/24 2037  BP:  (!) 149/81 (!) 136/97  Pulse:  89 79   Resp:  20 20   Temp:  98.2 F (36.8 C) 98.6 F (37 C)   TempSrc:  Oral Oral   SpO2:  97% 100% 100%  Weight: 82.6 kg     Height: 6' (1.829 m)      Constitutional: Acutely ill looking, in mild distress  Eyes: PERRL, lids and conjunctivae normal ENMT: Mucous membranes are dry. Posterior pharynx clear of any exudate or lesions.Normal dentition.  Neck: normal, supple, no masses, no thyromegaly Respiratory: clear to auscultation bilaterally, no wheezing, no crackles. Normal respiratory effort. No accessory muscle use.  Cardiovascular: Sinus tachycardia, no murmurs / rubs / gallops. No extremity edema. 2+ pedal pulses. No carotid bruits.  Abdomen: no tenderness, no masses palpated. No hepatosplenomegaly. Bowel sounds positive.  Musculoskeletal: Good range of motion, no joint swelling or tenderness, Skin: no rashes, lesions, ulcers. No induration Neurologic: CN 2-12 grossly intact. Sensation intact, DTR normal. Strength 5/5 in all 4.  Psychiatric: Normal judgment and insight. Alert and oriented x 3. Normal  mood  Data Reviewed:  Temperature 101.9, blood pressure 155/96, pulse 105 respiratory rate of 28 oxygen sat 98% on room air.  Sodium 134, glucose 100 calcium 7.9.  White count is 3.6 hemoglobin 11.4 and platelets 92.  PT 18.6 INR 1.5.  Acute viral screen is positive for COVID-19.  Chest x-ray showed no acute findings.  EKG shows sinus tachycardia.  Assessment and Plan:  #1 COVID-19 infection: Patient has no respiratory symptoms but appears to be dehydrated, weak, meet sepsis criteria due to the COVID-19.  Will admit the patient for supportive care.  He was taking methotrexate so he is relatively immunocompromised.  He will have been a good candidate for Paxlovid.  No steroids and no remdesivir.  Patient has no respiratory symptoms and not hypoxic.  #2 status post multiple falls at home: Probably from infection as a result of COVID-19.  Patient is overall weak.  At this point we will admit the patient and get PT and OT consultation.   #3 hyponatremia: Sodium was low.  Secondary dehydration.  Will hydrate.  #4 hyperlipidemia: Continue statin  #5 Parkinson's disease: Confirmed on resume home regimen.  Continue to monitor  #6 PTSD: Continue home regimen.    Advance Care Planning:   Code Status: Not on file full code  Consults: None  Family Communication: No family at bedside  Severity of Illness: The appropriate patient status for this patient is INPATIENT. Inpatient status is judged to be reasonable and necessary in order to provide the required intensity of service to ensure the patient's safety. The patient's presenting symptoms, physical exam findings, and initial radiographic and laboratory data in the context of their chronic comorbidities is felt to place them at high risk for further clinical deterioration. Furthermore, it is not anticipated that the patient will be medically stable for discharge from the hospital within 2 midnights of admission.   * I certify that at the point of  admission it is my clinical judgment that the patient will require inpatient hospital care spanning beyond 2 midnights from the point of admission due to high intensity of service, high risk for further deterioration and high frequency of surveillance required.*  AuthorBETHA SIM KNOLL, MD 07/29/2024 11:56 PM  For on call review [email or web address].

## 2024-07-29 NOTE — ED Triage Notes (Addendum)
 Pt comes via EMs from home with c/o fever, weakness and flu exposure.   101.9 oral 18 rac 101-105 HR 98% RA 155/96 RR 28  Pt has hx of parkinson. Wife with pt at this time.   Pt was given 500 fluids and 1000 iv tylenol 

## 2024-07-29 NOTE — ED Notes (Addendum)
 Called lab to delegate blood cultures x2 collection.

## 2024-07-30 DIAGNOSIS — U071 COVID-19: Secondary | ICD-10-CM | POA: Diagnosis not present

## 2024-07-30 LAB — CBC
HCT: 33.4 % — ABNORMAL LOW (ref 39.0–52.0)
Hemoglobin: 10.8 g/dL — ABNORMAL LOW (ref 13.0–17.0)
MCH: 32 pg (ref 26.0–34.0)
MCHC: 32.3 g/dL (ref 30.0–36.0)
MCV: 98.8 fL (ref 80.0–100.0)
Platelets: 81 K/uL — ABNORMAL LOW (ref 150–400)
RBC: 3.38 MIL/uL — ABNORMAL LOW (ref 4.22–5.81)
RDW: 14.2 % (ref 11.5–15.5)
WBC: 3 K/uL — ABNORMAL LOW (ref 4.0–10.5)
nRBC: 0 % (ref 0.0–0.2)

## 2024-07-30 LAB — URINALYSIS, W/ REFLEX TO CULTURE (INFECTION SUSPECTED)
Bacteria, UA: NONE SEEN
Bilirubin Urine: NEGATIVE
Glucose, UA: NEGATIVE mg/dL
Hgb urine dipstick: NEGATIVE
Ketones, ur: NEGATIVE mg/dL
Leukocytes,Ua: NEGATIVE
Nitrite: NEGATIVE
Protein, ur: NEGATIVE mg/dL
Specific Gravity, Urine: 1.019 (ref 1.005–1.030)
Squamous Epithelial / HPF: 0 /HPF (ref 0–5)
pH: 7 (ref 5.0–8.0)

## 2024-07-30 LAB — VITAMIN B12: Vitamin B-12: 376 pg/mL (ref 180–914)

## 2024-07-30 LAB — FOLATE: Folate: 8.3 ng/mL (ref >5.9)

## 2024-07-30 LAB — IRON AND TIBC
Iron: 26 ug/dL — ABNORMAL LOW (ref 45–182)
Saturation Ratios: 11 % — ABNORMAL LOW (ref 17.9–39.5)
TIBC: 238 ug/dL — ABNORMAL LOW (ref 250–450)
UIBC: 212 ug/dL

## 2024-07-30 LAB — COMPREHENSIVE METABOLIC PANEL WITH GFR
ALT: <5 U/L (ref 0–44)
AST: 19 U/L (ref 15–41)
Albumin: 3 g/dL — ABNORMAL LOW (ref 3.5–5.0)
Alkaline Phosphatase: 83 U/L (ref 38–126)
Anion gap: 5 (ref 5–15)
BUN: 16 mg/dL (ref 8–23)
CO2: 28 mmol/L (ref 22–32)
Calcium: 8.2 mg/dL — ABNORMAL LOW (ref 8.9–10.3)
Chloride: 108 mmol/L (ref 98–111)
Creatinine, Ser: 0.68 mg/dL (ref 0.61–1.24)
GFR, Estimated: >60 mL/min (ref >60)
Glucose, Bld: 88 mg/dL (ref 70–99)
Potassium: 4.4 mmol/L (ref 3.5–5.1)
Sodium: 139 mmol/L (ref 135–145)
Total Bilirubin: 0.9 mg/dL (ref 0.0–1.2)
Total Protein: 5.4 g/dL — ABNORMAL LOW (ref 6.5–8.1)

## 2024-07-30 LAB — MAGNESIUM: Magnesium: 2.1 mg/dL (ref 1.7–2.4)

## 2024-07-30 LAB — VITAMIN D 25 HYDROXY (VIT D DEFICIENCY, FRACTURES): Vit D, 25-Hydroxy: 49.81 ng/mL (ref 30–100)

## 2024-07-30 LAB — PHOSPHORUS: Phosphorus: 2.5 mg/dL (ref 2.5–4.6)

## 2024-07-30 LAB — CK: Total CK: 158 U/L (ref 49–397)

## 2024-07-30 MED ORDER — CARBIDOPA-LEVODOPA ER 50-200 MG PO TBCR
3.0000 | EXTENDED_RELEASE_TABLET | Freq: Two times a day (BID) | ORAL | Status: DC
  Administered 2024-07-30 – 2024-08-09 (×22): 3 via ORAL
  Filled 2024-07-30 (×23): qty 3

## 2024-07-30 MED ORDER — PIMAVANSERIN TARTRATE 34 MG PO CAPS
1.0000 | ORAL_CAPSULE | Freq: Every day | ORAL | Status: DC
  Administered 2024-07-30 – 2024-08-08 (×10): 34 mg via ORAL
  Filled 2024-07-30 (×12): qty 1

## 2024-07-30 MED ORDER — FLUTICASONE PROPIONATE 50 MCG/ACT NA SUSP
1.0000 | Freq: Every day | NASAL | Status: DC
  Administered 2024-07-30 – 2024-08-08 (×10): 1 via NASAL
  Filled 2024-07-30: qty 16

## 2024-07-30 MED ORDER — CARBIDOPA-LEVODOPA ER 50-200 MG PO TBCR
2.0000 | EXTENDED_RELEASE_TABLET | Freq: Every morning | ORAL | Status: DC
Start: 2024-07-30 — End: 2024-07-30

## 2024-07-30 MED ORDER — DONEPEZIL HCL 5 MG PO TABS
10.0000 mg | ORAL_TABLET | Freq: Every day | ORAL | Status: DC
  Administered 2024-07-30 – 2024-08-08 (×10): 10 mg via ORAL
  Filled 2024-07-30 (×10): qty 2

## 2024-07-30 MED ORDER — CARBIDOPA-LEVODOPA ER 50-200 MG PO TBCR
2.0000 | EXTENDED_RELEASE_TABLET | Freq: Every day | ORAL | Status: DC
  Administered 2024-07-30 – 2024-08-08 (×10): 2 via ORAL
  Filled 2024-07-30 (×12): qty 2

## 2024-07-30 MED ORDER — APIXABAN 2.5 MG PO TABS
2.5000 mg | ORAL_TABLET | Freq: Two times a day (BID) | ORAL | Status: DC
  Administered 2024-07-30 – 2024-08-09 (×21): 2.5 mg via ORAL
  Filled 2024-07-30 (×21): qty 1

## 2024-07-30 MED ORDER — TAMSULOSIN HCL 0.4 MG PO CAPS
0.4000 mg | ORAL_CAPSULE | Freq: Every day | ORAL | Status: DC
  Administered 2024-07-30 – 2024-08-08 (×10): 0.4 mg via ORAL
  Filled 2024-07-30 (×10): qty 1

## 2024-07-30 MED ORDER — FINASTERIDE 5 MG PO TABS
5.0000 mg | ORAL_TABLET | Freq: Every day | ORAL | Status: DC
  Administered 2024-07-30 – 2024-08-09 (×11): 5 mg via ORAL
  Filled 2024-07-30 (×11): qty 1

## 2024-07-30 MED ORDER — IRON SUCROSE 300 MG IVPB - SIMPLE MED
300.0000 mg | Freq: Once | Status: AC
  Administered 2024-07-30: 300 mg via INTRAVENOUS
  Filled 2024-07-30: qty 300

## 2024-07-30 MED ORDER — GABAPENTIN 100 MG PO CAPS
100.0000 mg | ORAL_CAPSULE | Freq: Every day | ORAL | Status: DC
  Administered 2024-07-30 – 2024-08-09 (×11): 100 mg via ORAL
  Filled 2024-07-30 (×11): qty 1

## 2024-07-30 MED ORDER — CARBIDOPA-LEVODOPA 25-100 MG PO TABS
1.0000 | ORAL_TABLET | Freq: Two times a day (BID) | ORAL | Status: DC
  Administered 2024-07-30 – 2024-08-09 (×21): 1 via ORAL
  Filled 2024-07-30 (×22): qty 1

## 2024-07-30 MED ORDER — CLONAZEPAM 1 MG PO TABS
1.5000 mg | ORAL_TABLET | Freq: Every day | ORAL | Status: DC
  Administered 2024-07-30 – 2024-08-08 (×10): 1.5 mg via ORAL
  Filled 2024-07-30 (×10): qty 1

## 2024-07-30 MED ORDER — MEDIHONEY WOUND/BURN DRESSING EX PSTE
1.0000 | PASTE | Freq: Every day | CUTANEOUS | Status: DC
  Administered 2024-07-30 – 2024-08-09 (×11): 1 via TOPICAL
  Filled 2024-07-30: qty 44

## 2024-07-30 MED ORDER — METOPROLOL SUCCINATE ER 25 MG PO TB24
12.5000 mg | ORAL_TABLET | Freq: Every day | ORAL | Status: DC
Start: 2024-07-30 — End: 2024-08-06
  Administered 2024-07-30 – 2024-08-05 (×7): 12.5 mg via ORAL
  Filled 2024-07-30 (×8): qty 1

## 2024-07-30 MED ORDER — VENLAFAXINE HCL ER 75 MG PO CP24
150.0000 mg | ORAL_CAPSULE | Freq: Every day | ORAL | Status: DC
  Administered 2024-07-30 – 2024-08-09 (×11): 150 mg via ORAL
  Filled 2024-07-30 (×3): qty 2
  Filled 2024-07-30: qty 1
  Filled 2024-07-30 (×7): qty 2

## 2024-07-30 MED ORDER — SALINE SPRAY 0.65 % NA SOLN
1.0000 | NASAL | Status: DC | PRN
  Filled 2024-07-30: qty 44

## 2024-07-30 MED ORDER — TRAZODONE HCL 100 MG PO TABS
100.0000 mg | ORAL_TABLET | Freq: Every day | ORAL | Status: DC
  Administered 2024-07-30 – 2024-08-08 (×10): 100 mg via ORAL
  Filled 2024-07-30 (×10): qty 1

## 2024-07-30 NOTE — ED Notes (Signed)
 Pt changed into a new diaper and cleaned up. Repositoned in bed

## 2024-07-30 NOTE — ED Notes (Signed)
 At around 0100, pt's wife wanting to know when he will get his parkinson's medications. Informed her that pharmacy tech will come to bedside to reconcile meds. Pharmacy tech has come to bedside and reconciled medications.

## 2024-07-30 NOTE — Consult Note (Signed)
 WOC Nurse Consult Note: Reason for Consult: Sacral wound  Wound type: Stage 3 Pressure Injury  Pressure Injury POA: Yes Measurement: see nursing flowsheet  Wound azi:jeezjmd red and moist  Drainage (amount, consistency, odor) see nursing flowsheet  Periwound: mild erythema  Dressing procedure/placement/frequency: Cleanse sacral wound with NS, apply Medihoney to wound bed daily and fill in wound with dry gauze.  Secure with silicone foam or ABD pad whichever is preferred.   POC discussed with bedside nurse. WOC team will not follow. [facility] and should continue with their plan of care at discharge. Reportedly uses Silvadene and Medihoney on wound at home.   Thank you,    Powell Bar MSN, RN-BC, [employer]

## 2024-07-30 NOTE — TOC CM/SW Note (Addendum)
 Transition of Care [facility]) - Inpatient Brief Assessment   Patient Details  Name: PURCELL JUNGBLUTH MRN: [id number] Date of Birth: 1946-04-06  [facility] CM/SW Contact:    Edsel DELENA Fischer, LCSW Phone Number: 07/30/2024, 12:40 PM   Clinical Narrative:  TOC contacted wife at [phone]  regarding discharge planning x2. No answer. TOC left message.   Transition of Care Asessment:

## 2024-07-30 NOTE — ED Notes (Addendum)
 Patient's wife asking if she should give patient's regularly scheduled medications from home supply. Told wife not to give any medications until verified by pharmacy and orders are in. Pharmacy called to prioritize med rec per patient's wife's request.

## 2024-07-30 NOTE — Evaluation (Signed)
 Occupational Therapy Evaluation Patient Details Name: Rodney Richmond MRN: [id number] DOB: [birth year] Today's Date: 07/30/2024   History of Present Illness   78 y/o male presented to ED on 07/29/24 for fever, weakness, and falls. Tested positive for COVID. PMH: Parkinson's, benign brain tumor, HTN, PTSD     Clinical Impressions Patient presenting with decreased Ind in self care, balance, functional mobility/transfers, endurance, and safety awareness. Patient reports living at home with wife. He has a caregiver that assists him with bathing but endorses being able to dress himself. Pt ambulates short distances and uses wheelchair at home. No family present to confirm baseline. Pt needing +2 assistance for mobility to stand from EOB and for side steps along EOB. He fatigues very quickly with effort this session. Pt likely needing heavy assistance with all self care tasks at this time and +2 assistance for toilet transfer.  Patient will benefit from acute OT to increase overall independence in the areas of ADLs, functional mobility, and safety awareness in order to safely discharge.     If plan is discharge home, recommend the following:   Two people to help with walking and/or transfers;Two people to help with bathing/dressing/bathroom;Assistance with cooking/housework;Assist for transportation;Help with stairs or ramp for entrance     Functional Status Assessment   Patient has had a recent decline in their functional status and demonstrates the ability to make significant improvements in function in a reasonable and predictable amount of time.     Equipment Recommendations   Other (comment) (defer to next venue of care)      Precautions/Restrictions   Precautions Precautions: Fall Recall of Precautions/Restrictions: Intact     Mobility Bed Mobility Overal bed mobility: Needs Assistance Bed Mobility: Supine to Sit, Sit to Supine     Supine to sit: Max assist, +2 for  physical assistance, +2 for safety/equipment Sit to supine: Max assist, +2 for physical assistance, +2 for safety/equipment        Transfers Overall transfer level: Needs assistance Equipment used: 2 person hand held assist Transfers: Sit to/from Stand Sit to Stand: Mod assist, +2 physical assistance, +2 safety/equipment                  Balance Overall balance assessment: Needs assistance Sitting-balance support: No upper extremity supported, Feet supported Sitting balance-Leahy Scale: Fair     Standing balance support: Bilateral upper extremity supported, During functional activity Standing balance-Leahy Scale: Poor                             ADL either performed or assessed with clinical judgement   ADL Overall ADL's : Needs assistance/impaired                         Toilet Transfer: +2 for physical assistance;Maximal assistance                   Vision Patient Visual Report: No change from baseline              Pertinent Vitals/Pain Pain Assessment Pain Assessment: No/denies pain     Extremity/Trunk Assessment Upper Extremity Assessment Upper Extremity Assessment: Generalized weakness   Lower Extremity Assessment Lower Extremity Assessment: Generalized weakness   Cervical / Trunk Assessment Cervical / Trunk Assessment: Kyphotic   Communication Communication Communication: Impaired Factors Affecting Communication: Reduced clarity of speech   Cognition Arousal: Alert Behavior During Therapy: WFL for tasks assessed/performed Cognition:  No family/caregiver present to determine baseline                               Following commands: Intact       Cueing  General Comments   Cueing Techniques: Verbal cues  VSS on RA           Home Living Family/patient expects to be discharged to:: Private residence Living Arrangements: Spouse/significant other Available Help at Discharge: Family Type of  Home: House Home Access: Stairs to enter;Other (comment) (automated lift to access home) Entrance Stairs-Number of Steps: 3   Home Layout: One level     Bathroom Shower/Tub: Producer, television/film/video: Standard     Home Equipment: Agricultural consultant (2 wheels);Wheelchair - manual;Shower seat          Prior Functioning/Environment Prior Level of Function : Needs assist;History of Falls (last six months)             Mobility Comments: uses RW for very short distances. Mostly uses manual w/c - someone else pushes it ADLs Comments: Aide comes every day for a few hours to assist with ADLs. Wears disposable briefs at home but able to get on/off toilet per patient    OT Problem List: Decreased strength;Decreased activity tolerance;Decreased knowledge of use of DME or AE;Impaired balance (sitting and/or standing);Decreased safety awareness   OT Treatment/Interventions: Self-care/ADL training;Therapeutic activities;Therapeutic exercise;Energy conservation;Patient/family education;Balance training      OT Goals(Current goals can be found in the care plan section)   Acute Rehab OT Goals Patient Stated Goal: to go home OT Goal Formulation: With patient Time For Goal Achievement: 08/13/24 Potential to Achieve Goals: Fair ADL Goals Pt Will Perform Grooming: with supervision;standing Pt Will Perform Lower Body Dressing: with min assist;sit to/from stand Pt Will Transfer to Toilet: with min assist;stand pivot transfer Pt Will Perform Toileting - Clothing Manipulation and hygiene: with min assist;sit to/from stand   OT Frequency:  Min 1X/week    Co-evaluation PT/OT/SLP Co-Evaluation/Treatment: Yes Reason for Co-Treatment: For patient/therapist safety;To address functional/ADL transfers;Complexity of the patient's impairments (multi-system involvement) PT goals addressed during session: Mobility/safety with mobility;Balance OT goals addressed during session: ADL's and  self-care      AM-PAC OT 6 Clicks Daily Activity     Outcome Measure Help from another person eating meals?: A Little Help from another person taking care of personal grooming?: A Little Help from another person toileting, which includes using toliet, bedpan, or urinal?: A Lot Help from another person bathing (including washing, rinsing, drying)?: A Lot Help from another person to put on and taking off regular upper body clothing?: A Lot Help from another person to put on and taking off regular lower body clothing?: A Lot 6 Click Score: 14   End of Session Equipment Utilized During Treatment: Rolling walker (2 wheels) Nurse Communication: Mobility status  Activity Tolerance: Patient tolerated treatment well Patient left: in bed;with call bell/phone within reach;with bed alarm set  OT Visit Diagnosis: Unsteadiness on feet (R26.81);Repeated falls (R29.6);Muscle weakness (generalized) (M62.81)                Time: 9094-9069 OT Time Calculation (min): 25 min Charges:  OT General Charges $OT Visit: 1 Visit OT Evaluation $OT Eval Moderate Complexity: 1 [address] [place], OTR/L , CBIS ascom [phone]  07/30/24, 1:24 PM

## 2024-07-30 NOTE — Progress Notes (Signed)
 [facility] Progress Note  Patient: Rodney Richmond    FMW:[id number]  DOA: 07/29/2024     Date of Service: the patient was seen and examined on 07/30/2024  Chief Complaint  Patient presents with   Fever   Brief hospital course: Rodney Richmond is a 78 y.o. male with medical history significant of essential hypertension, Parkinson's disease, PTSD, history of prior brain tumor who was brought in by family secondary to generalized weakness and multiple falls at home.  Patient had at least 3 episode of falling at home today where he slumped on the floor.  EMS was called all 3 times.  Ultimately after the third episode patient was brought to the ER with generalized weakness and fall.  There is associated fever and weakness.  Family believe patient had exposure to the flu.  Wife at bedside.  On arrival his temperature is 101.9 and heart rate 101-105.  He is 98% on room air and blood pressure was stable.  His respiratory rate however was 28.  Patient had workup for infectious sources and so far COVID-19 was positive.  Patient suspected to have acute illness with generalized weakness and fall as a result of COVID-19 infection.  He is not having any respiratory symptoms.  Not hypoxic.  Patient will be admitted for further supportive care due to failure to thrive at home and recurrent falling.    Assessment and Plan:  # COVID-19 infection: Patient has no respiratory symptoms but appears to be dehydrated, weak, meet sepsis criteria due to the COVID-19.   Continue supportive care.  He was taking methotrexate so he is relatively immunocompromised.  He will have been a good candidate for Paxlovid.  No steroids and no remdesivir.  Patient has no respiratory symptoms and not hypoxic.   # status post multiple falls at home: Probably from infection as a result of COVID-19.  Patient is overall weak.  At this point we will admit the patient and get PT and OT consultation.    # hyponatremia: Possible due to  nutritional deficiency and dehydration. S/p IVF, sodium improved.  # hyperlipidemia: Continue statin CK 158 wnl   # Parkinson's disease: Confirmed on resume home regimen.  Continue to monitor   # PTSD: Continue home regimen.   # Iron  deficiency, TSAT 11% Venofer  300 mg IV one-time dose given, continue oral iron  supplement and follow with PCP.   Body mass index is 24.68 kg/m.  Interventions:  Diet: Heart healthy diet DVT Prophylaxis: Eliquis   Advance goals of care discussion: Full code  Family Communication: family was not present at bedside, at the time of interview.  The pt provided permission to discuss medical plan with the family. Opportunity was given to ask question and all questions were answered satisfactorily.   Disposition:  Pt is from Home, admitted with Fall and COVID, still has risk of fall, which precludes a safe discharge. Discharge to SNF, when stable.  Subjective: No significant events overnight, patient still feels generalized weakness and generalized bodyaches, denied any specific complaints.  Physical Exam: General: NAD, lying comfortably Appear in no distress, affect appropriate Eyes: PERRLA ENT: Oral Mucosa Clear, moist  Neck: no JVD,  Cardiovascular: S1 and S2 Present, no Murmur,  Respiratory: good respiratory effort, Bilateral Air entry equal and Decreased, no Crackles, no wheezes Abdomen: Bowel Sound present, Soft and no tenderness,  Skin: no rashes Extremities: no Pedal edema, no calf tenderness Neurologic: without any new focal findings Gait not checked due to patient safety concerns  Vitals:   07/30/24 1230 07/30/24 1300 07/30/24 1330 07/30/24 1429  BP: 136/77 135/76 (!) 145/83 (!) 149/82  Pulse: 64 63 73 73  Resp: 15 14 15 19   Temp:    98.5 F (36.9 C)  TempSrc:    Oral  SpO2: 98% 98% 98% 98%  Weight:      Height:        Intake/Output Summary (Last 24 hours) at 07/30/2024 1529 Last data filed at 07/30/2024 1405 Gross per 24 hour   Intake 500 ml  Output 150 ml  Net 350 ml   Filed Weights   07/29/24 1858  Weight: 82.6 kg    Data Reviewed: I have personally reviewed and interpreted daily labs, tele strips, imagings as discussed above. I reviewed all nursing notes, pharmacy notes, vitals, pertinent old records I have discussed plan of care as described above with RN and patient/family.  CBC: Recent Labs  Lab 07/29/24 1908  WBC 3.6*  NEUTROABS 2.8  HGB 11.4*  HCT 34.1*  MCV 98.0  PLT 92*   Basic Metabolic Panel: Recent Labs  Lab 07/29/24 1908 07/30/24 0238  NA 134* 139  K 4.0 4.4  CL 103 108  CO2 25 28  GLUCOSE 100* 88  BUN 19 16  CREATININE 0.71 0.68  CALCIUM 7.9* 8.2*  MG  --  2.1  PHOS  --  2.5    Studies: DG Chest 2 View Result Date: 07/29/2024 CLINICAL DATA:  Fever EXAM: CHEST - 2 VIEW COMPARISON:  10/05/2011 FINDINGS: Heart and mediastinal contours are within normal limits. No focal opacities or effusions. No acute bony abnormality. IMPRESSION: No active cardiopulmonary disease. Electronically Signed   By: Franky Crease M.D.   On: 07/29/2024 19:52    Scheduled Meds:  apixaban   2.5 mg Oral BID   carbidopa -levodopa   3 tablet Oral BID   And   carbidopa -levodopa   2 tablet Oral QHS   carbidopa -levodopa   1 tablet Oral BID   clonazePAM   1.5 mg Oral QHS   donepezil   10 mg Oral QHS   finasteride   5 mg Oral Daily   gabapentin   100 mg Oral Daily   metoprolol  succinate  12.5 mg Oral Daily   Pimavanserin  Tartrate  1 capsule Oral QHS   tamsulosin   0.4 mg Oral QHS   traZODone   100 mg Oral QHS   venlafaxine  XR  150 mg Oral Q breakfast   Continuous Infusions:  dextrose  5% lactated ringers  100 mL/hr at 07/30/24 1403   PRN Meds: acetaminophen  **OR** acetaminophen , ondansetron  **OR** ondansetron  (ZOFRAN ) IV  Time spent: 35 minutes  Author: ELVAN SOR. MD [facility] 07/30/2024 3:29 PM  To reach On-call, see care teams to locate the attending and reach out to them via  [email or web address]. If 7PM-7AM, please contact night-coverage If you still have difficulty reaching the attending provider, please page the [facility] (Director on Call) for [facility] on amion for assistance.

## 2024-07-30 NOTE — Evaluation (Signed)
 Physical Therapy Evaluation Patient Details Name: Rodney Richmond MRN: [id number] DOB: Mar 26, 1946 Today's Date: 07/30/2024  History of Present Illness  78 y/o male presented to ED on 07/29/24 for fever, weakness, and falls. Tested positive for COVID. PMH: Parkinson's, benign brain tumor, HTN, PTSD  Clinical Impression  Patient admitted with the above. PTA, patient lives at home with his wife and was using RW for very short distances and mostly manual w/c for mobility with family member pushing him. Patient presents with weakness, impaired balance, and decreased activity tolerance. Seen prior to RN administering Parkinson's medications. Required maxA+2 for bed mobility and demonstrates increased stiffness. Stood from EOB with modA+2 and HHAx2. Took sidesteps at EOB with HHAx2 and modA+2 with heavy L lateral lean to weight shift. Patient will benefit from skilled PT services during acute stay to address listed deficits. Patient will benefit from ongoing therapy at discharge to maximize functional independence and safety.       If plan is discharge home, recommend the following: Two people to help with walking and/or transfers;A lot of help with bathing/dressing/bathroom;Assistance with cooking/housework;Assist for transportation;Help with stairs or ramp for entrance;Direct supervision/assist for medications management;Direct supervision/assist for financial management;Assistance with feeding   Can travel by private vehicle   No    Equipment Recommendations Other (comment) (TBD)  Recommendations for Other Services       Functional Status Assessment Patient has had a recent decline in their functional status and demonstrates the ability to make significant improvements in function in a reasonable and predictable amount of time.     Precautions / Restrictions Precautions Precautions: Fall Recall of Precautions/Restrictions: Intact Restrictions Weight Bearing Restrictions Per Provider Order: No       Mobility  Bed Mobility Overal bed mobility: Needs Assistance Bed Mobility: Supine to Sit, Sit to Supine     Supine to sit: Max assist, +2 for physical assistance, +2 for safety/equipment Sit to supine: Max assist, +2 for physical assistance, +2 for safety/equipment        Transfers Overall transfer level: Needs assistance Equipment used: 2 person hand held assist Transfers: Sit to/from Stand Sit to Stand: Mod assist, +2 physical assistance, +2 safety/equipment                Ambulation/Gait Ambulation/Gait assistance: Mod assist, +2 physical assistance, +2 safety/equipment Gait Distance (Feet): 4 Feet Assistive device: 2 person hand held assist   Gait velocity: decreased     General Gait Details: sidestepping at EOB with assist for weightshifting and maintain balance  Stairs            Wheelchair Mobility     Tilt Bed    Modified Rankin (Stroke Patients Only)       Balance Overall balance assessment: Needs assistance Sitting-balance support: No upper extremity supported, Feet supported Sitting balance-Leahy Scale: Fair     Standing balance support: Bilateral upper extremity supported, During functional activity Standing balance-Leahy Scale: Poor                               Pertinent Vitals/Pain Pain Assessment Pain Assessment: No/denies pain    Home Living Family/patient expects to be discharged to:: Private residence Living Arrangements: Spouse/significant other Available Help at Discharge: Family Type of Home: House Home Access: Stairs to enter;Other (comment) (automated lift to accdss home)   Entrance Stairs-Number of Steps: 3   Home Layout: One level Home Equipment: Agricultural consultant (2 wheels);Wheelchair - Manufacturing systems engineer  Prior Function Prior Level of Function : Needs assist;History of Falls (last six months)             Mobility Comments: uses RW for very short distances. Mostly uses manual w/c -  someone else pushes it ADLs Comments: Aide comes every day for a few hours to assist with ADLs. Wears disposable briefs at home but able to get on/off toilet per patient     Extremity/Trunk Assessment   Upper Extremity Assessment Upper Extremity Assessment: Defer to OT evaluation    Lower Extremity Assessment Lower Extremity Assessment: Generalized weakness (very stiff BLEs)    Cervical / Trunk Assessment Cervical / Trunk Assessment: Kyphotic  Communication   Communication Communication: Impaired Factors Affecting Communication: Reduced clarity of speech    Cognition Arousal: Alert Behavior During Therapy: WFL for tasks assessed/performed   PT - Cognitive impairments: No family/caregiver present to determine baseline                       PT - Cognition Comments: difficult to understand at times due to reduced clarity and volume of speech Following commands: Intact       Cueing       General Comments General comments (skin integrity, edema, etc.): VSS on RA    Exercises     Assessment/Plan    PT Assessment Patient needs continued PT services  PT Problem List Decreased strength;Decreased activity tolerance;Decreased balance;Decreased mobility;Decreased coordination;Decreased knowledge of use of DME;Decreased safety awareness;Decreased knowledge of precautions;Cardiopulmonary status limiting activity       PT Treatment Interventions DME instruction;Gait training;Functional mobility training;Therapeutic activities;Therapeutic exercise;Balance training;Neuromuscular re-education;Patient/family education    PT Goals (Current goals can be found in the Care Plan section)  Acute Rehab PT Goals Patient Stated Goal: did not state PT Goal Formulation: With patient Time For Goal Achievement: 08/13/24 Potential to Achieve Goals: Fair    Frequency Min 1X/week     Co-evaluation PT/OT/SLP Co-Evaluation/Treatment: Yes Reason for Co-Treatment: For  patient/therapist safety;To address functional/ADL transfers;Complexity of the patient's impairments (multi-system involvement) PT goals addressed during session: Mobility/safety with mobility;Balance         AM-PAC PT 6 Clicks Mobility  Outcome Measure Help needed turning from your back to your side while in a flat bed without using bedrails?: Total Help needed moving from lying on your back to sitting on the side of a flat bed without using bedrails?: Total Help needed moving to and from a bed to a chair (including a wheelchair)?: Total Help needed standing up from a chair using your arms (e.g., wheelchair or bedside chair)?: Total Help needed to walk in hospital room?: Total Help needed climbing 3-5 steps with a railing? : Total 6 Click Score: 6    End of Session   Activity Tolerance: Patient tolerated treatment well Patient left: in bed;with call bell/phone within reach;with bed alarm set Nurse Communication: Mobility status PT Visit Diagnosis: Unsteadiness on feet (R26.81);Muscle weakness (generalized) (M62.81);Other abnormalities of gait and mobility (R26.89)    Time: 9094-9070 PT Time Calculation (min) (ACUTE ONLY): 24 min   Charges:   PT Evaluation $PT Eval Moderate Complexity: 1 Mod   PT General Charges $$ ACUTE PT VISIT: 1 Visit         Maryanne Finder, PT, DPT Physical Therapist - [facility]   Rie Mcneil A Leora Platt 07/30/2024, 10:56 AM

## 2024-07-31 DIAGNOSIS — Z515 Encounter for palliative care: Secondary | ICD-10-CM | POA: Diagnosis not present

## 2024-07-31 DIAGNOSIS — W19XXXA Unspecified fall, initial encounter: Secondary | ICD-10-CM | POA: Diagnosis not present

## 2024-07-31 DIAGNOSIS — R531 Weakness: Secondary | ICD-10-CM | POA: Diagnosis not present

## 2024-07-31 DIAGNOSIS — Y92009 Unspecified place in unspecified non-institutional (private) residence as the place of occurrence of the external cause: Secondary | ICD-10-CM

## 2024-07-31 DIAGNOSIS — U071 COVID-19: Secondary | ICD-10-CM | POA: Diagnosis not present

## 2024-07-31 LAB — BASIC METABOLIC PANEL WITH GFR
Anion gap: 6 (ref 5–15)
BUN: 15 mg/dL (ref 8–23)
CO2: 28 mmol/L (ref 22–32)
Calcium: 8.1 mg/dL — ABNORMAL LOW (ref 8.9–10.3)
Chloride: 108 mmol/L (ref 98–111)
Creatinine, Ser: 0.87 mg/dL (ref 0.61–1.24)
GFR, Estimated: >60 mL/min (ref >60)
Glucose, Bld: 80 mg/dL (ref 70–99)
Potassium: 3.9 mmol/L (ref 3.5–5.1)
Sodium: 142 mmol/L (ref 135–145)

## 2024-07-31 LAB — CBC
HCT: 35.1 % — ABNORMAL LOW (ref 39.0–52.0)
Hemoglobin: 11.7 g/dL — ABNORMAL LOW (ref 13.0–17.0)
MCH: 32.6 pg (ref 26.0–34.0)
MCHC: 33.3 g/dL (ref 30.0–36.0)
MCV: 97.8 fL (ref 80.0–100.0)
Platelets: 84 K/uL — ABNORMAL LOW (ref 150–400)
RBC: 3.59 MIL/uL — ABNORMAL LOW (ref 4.22–5.81)
RDW: 14.1 % (ref 11.5–15.5)
WBC: 4 K/uL (ref 4.0–10.5)
nRBC: 0 % (ref 0.0–0.2)

## 2024-07-31 LAB — MAGNESIUM: Magnesium: 2.1 mg/dL (ref 1.7–2.4)

## 2024-07-31 LAB — PHOSPHORUS: Phosphorus: 2.8 mg/dL (ref 2.5–4.6)

## 2024-07-31 MED ORDER — POLYSACCHARIDE IRON COMPLEX 150 MG PO CAPS
150.0000 mg | ORAL_CAPSULE | Freq: Every day | ORAL | Status: DC
  Administered 2024-07-31 – 2024-08-09 (×10): 150 mg via ORAL
  Filled 2024-07-31 (×10): qty 1

## 2024-07-31 MED ORDER — VITAMIN B-12 1000 MCG PO TABS
1000.0000 ug | ORAL_TABLET | Freq: Every day | ORAL | Status: DC
  Administered 2024-07-31 – 2024-08-09 (×10): 1000 ug via ORAL
  Filled 2024-07-31 (×10): qty 1

## 2024-07-31 MED ORDER — VITAMIN C 500 MG PO TABS
500.0000 mg | ORAL_TABLET | Freq: Every day | ORAL | Status: DC
  Administered 2024-07-31 – 2024-08-09 (×10): 500 mg via ORAL
  Filled 2024-07-31 (×10): qty 1

## 2024-07-31 NOTE — Progress Notes (Signed)
 [facility] Progress Note  Patient: Rodney Richmond    FMW:[id number]  DOA: 07/29/2024     Date of Service: the patient was seen and examined on 07/31/2024  Chief Complaint  Patient presents with   Fever   Brief hospital course: Rodney Richmond is a 78 y.o. male with medical history significant of essential hypertension, Parkinson's disease, PTSD, history of prior brain tumor who was brought in by family secondary to generalized weakness and multiple falls at home.  Patient had at least 3 episode of falling at home today where he slumped on the floor.  EMS was called all 3 times.  Ultimately after the third episode patient was brought to the ER with generalized weakness and fall.  There is associated fever and weakness.  Family believe patient had exposure to the flu.  Wife at bedside.  On arrival his temperature is 101.9 and heart rate 101-105.  He is 98% on room air and blood pressure was stable.  His respiratory rate however was 28.  Patient had workup for infectious sources and so far COVID-19 was positive.  Patient suspected to have acute illness with generalized weakness and fall as a result of COVID-19 infection.  He is not having any respiratory symptoms.  Not hypoxic.  Patient will be admitted for further supportive care due to failure to thrive at home and recurrent falling.    Assessment and Plan:  # COVID-19 infection: Patient has no respiratory symptoms but appears to be dehydrated, weak, meet sepsis criteria due to the COVID-19.   Continue supportive care.  He was taking methotrexate so he is relatively immunocompromised.  He will have been a good candidate for Paxlovid.  No steroids and no remdesivir.  Patient has no respiratory symptoms and not hypoxic.   # status post multiple falls at home: Probably from infection as a result of COVID-19.  Patient is overall weak.  At this point we will admit the patient and get PT and OT consultation.    # hyponatremia: Possible due to  nutritional deficiency and dehydration. S/p IVF, sodium improved.  # hyperlipidemia: Continue statin CK 158 wnl   # Parkinson's disease: Confirmed on resume home regimen.  Continue to monitor   # PTSD: Continue home regimen.   # Iron  deficiency, TSAT 11% Venofer  300 mg IV one-time dose given, continue oral iron  supplement and follow with PCP.  # h/o LLE on Eliquis .  Patient is following vascular surgery as an outpatient who will let her mind when to discontinue DOAC  Body mass index is 24.68 kg/m.  Interventions:  Diet: Heart healthy diet DVT Prophylaxis: Eliquis   Advance goals of care discussion: Full code  Family Communication: family was not present at bedside, at the time of interview.  The pt provided permission to discuss medical plan with the family. Opportunity was given to ask question and all questions were answered satisfactorily.   Disposition:  Pt is from Home, admitted with Fall and COVID, still has risk of fall, which precludes a safe discharge. Discharge to SNF, when stable.  Most likely tomorrow a.m. if bed will be available.   Subjective: No significant events overnight, patient is feeling improvement in the generalized weakness and bodyaches.  Patient would like to go home, but has not worked with physical therapy yet. DC plan discussed with patient's wife, patient will be monitored today and work with physical therapy, if stable then he can go home tomorrow versus SNF.   Physical Exam: General: NAD, lying  comfortably Appear in no distress, affect appropriate Eyes: PERRLA ENT: Oral Mucosa Clear, moist  Neck: no JVD,  Cardiovascular: S1 and S2 Present, no Murmur,  Respiratory: good respiratory effort, Bilateral Air entry equal and Decreased, no Crackles, no wheezes Abdomen: Bowel Sound present, Soft and no tenderness,  Skin: no rashes Extremities: no Pedal edema, no calf tenderness Neurologic: without any new focal findings Gait not checked due to  patient safety concerns  Vitals:   07/31/24 0508 07/31/24 0512 07/31/24 0751 07/31/24 0927  BP:   (!) 145/76 116/64  Pulse:   (!) 49 75  Resp:   17   Temp:   98 F (36.7 C)   TempSrc:      SpO2: 100% 91% 98%   Weight:      Height:        Intake/Output Summary (Last 24 hours) at 07/31/2024 1418 Last data filed at 07/31/2024 0458 Gross per 24 hour  Intake --  Output 1250 ml  Net -1250 ml   Filed Weights   07/29/24 1858  Weight: 82.6 kg    Data Reviewed: I have personally reviewed and interpreted daily labs, tele strips, imagings as discussed above. I reviewed all nursing notes, pharmacy notes, vitals, pertinent old records I have discussed plan of care as described above with RN and patient/family.  CBC: Recent Labs  Lab 07/29/24 1908 07/30/24 1504 07/31/24 0518  WBC 3.6* 3.0* 4.0  NEUTROABS 2.8  --   --   HGB 11.4* 10.8* 11.7*  HCT 34.1* 33.4* 35.1*  MCV 98.0 98.8 97.8  PLT 92* 81* 84*   Basic Metabolic Panel: Recent Labs  Lab 07/29/24 1908 07/30/24 0238 07/31/24 0518  NA 134* 139 142  K 4.0 4.4 3.9  CL 103 108 108  CO2 25 28 28   GLUCOSE 100* 88 80  BUN 19 16 15   CREATININE 0.71 0.68 0.87  CALCIUM 7.9* 8.2* 8.1*  MG  --  2.1 2.1  PHOS  --  2.5 2.8    Studies: No results found.   Scheduled Meds:  apixaban   2.5 mg Oral BID   vitamin C   500 mg Oral Daily   carbidopa -levodopa   3 tablet Oral BID   And   carbidopa -levodopa   2 tablet Oral QHS   carbidopa -levodopa   1 tablet Oral BID   clonazePAM   1.5 mg Oral QHS   vitamin B-12  1,000 mcg Oral Daily   donepezil   10 mg Oral QHS   finasteride   5 mg Oral Daily   fluticasone   1 spray Each Nare QHS   gabapentin   100 mg Oral Daily   iron  polysaccharides  150 mg Oral Daily   leptospermum manuka honey  1 Application Topical Daily   metoprolol  succinate  12.5 mg Oral Daily   Pimavanserin  Tartrate  1 capsule Oral QHS   tamsulosin   0.4 mg Oral QHS   traZODone   100 mg Oral QHS   venlafaxine  XR  150 mg Oral  Q breakfast   Continuous Infusions:   PRN Meds: acetaminophen  **OR** acetaminophen , ondansetron  **OR** ondansetron  (ZOFRAN ) IV, sodium chloride   Time spent: 35 minutes  Author: ELVAN SOR. MD [facility] 07/31/2024 2:18 PM  To reach On-call, see care teams to locate the attending and reach out to them via [email or web address]. If 7PM-7AM, please contact night-coverage If you still have difficulty reaching the attending provider, please page the [facility] (Director on Call) for [facility] on amion for assistance.

## 2024-07-31 NOTE — Consult Note (Addendum)
 Consultation Note Date: 07/31/2024 at 1110  Patient Name: Rodney Richmond  DOB: Jun 09, 1946  MRN: [id number]  Age / Sex: 78 y.o., male  PCP: System, Provider Not In Referring Physician: Von Bellis, MD  HPI/Patient Profile: 78 y.o. male  with past medical history of HTN, Parkinson's disease, PTSD, prior brain tumor admitted on 07/29/2024 with generalized weakness and multiple falls.  Patient found to be COVID-positive.  However, patient is not hypoxic or having respiratory symptoms.  Patient is being treated with Paxlovid but no steroids or remdesivir given his use of methotrexate and potentially immunocompromised state.  PMT was consulted to support patient and family with goals of care discussion..   Clinical Assessment and Goals of Care: Extensive chart review completed prior to meeting patient including labs, vital signs, imaging, progress notes, orders, and available advanced directive documents from current and previous encounters. I then met with patient at bedside to discuss diagnosis prognosis, GOC, EOL wishes, disposition and options.  I introduced Palliative Medicine as specialized medical care for people living with serious illness. It focuses on providing relief from the symptoms and stress of a serious illness. The goal is to improve quality of life for both the patient and the family.  We discussed a brief life review of the patient.  After getting married young, he enrolled in the [employer] and is his best friend of [place] [employer].  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  He enjoys discussing [employer] football and American sports cars -specifically corvettes and porsches.  As far as functional and nutritional status patient endorses he walks with a walker at home.  He shares he stood with PT earlier today and took a few steps.  He  denies difficulty with p.o. intake or change in diet PTA.  We discussed his current acute illness-COVID-19-in addition to his ongoing, chronic comorbidities. He shares that overall he has a great quality of life.  He would like to be able to go outside more but he is unable to get out on him such as uneven gravel/driveway.  I attempted to elicit values and goals of care important to the patient. Advance directives, concepts specific to code status, artificial feeding and hydration, and rehospitalization were considered and discussed.  Patient shares that his wife and daughter have discussed this before.  He shares that he would like to be given a chance to live.  He is accepting of CPR and ACLS protocol in event of a cardiopulmonary arrest.  However, he shares that he would never want to be kept alive by machines long-term-no trach or PEG.  He shares that he feels young and has more life to live.  He is hopeful that once he is able to clear this COVID he can return home without having lost significant functional mobility.  Encouraged patient to consider DNR/DNI status understanding evidenced based poor outcomes in similar hospitalized patients, as the cause of the arrest is likely associated  with chronic/terminal disease rather than a reversible acute cardio-pulmonary event.     Symptoms assessed.  Patient shares that he is feeling much better than he was a few days ago.  He shares that he has a cough but that it is not severe.  He endorses it is dry and not painful.  He denies chest pain, headache, sore throat, malaise, body aches, chills.  No adjustment to [facility] needed at this time.  Discussed with patient/family the importance of continued conversation with family and the medical providers regarding overall plan of care and treatment options, ensuring decisions are within the context of the patient's values and GOCs.    Questions and concerns were addressed.  Patient was encouraged to call with  questions or concerns.   Primary Decision Maker PATIENT  Physical Exam Vitals reviewed.  Constitutional:      General: He is not in acute distress.    Appearance: He is normal weight.  HENT:     Head: Normocephalic.     Mouth/Throat:     Mouth: Mucous membranes are moist.  Eyes:     Pupils: Pupils are equal, round, and reactive to light.  Pulmonary:     Effort: Pulmonary effort is normal.  Abdominal:     Palpations: Abdomen is soft.  Skin:    General: Skin is warm and dry.  Neurological:     Mental Status: He is alert and oriented to person, place, and time.  Psychiatric:        Mood and Affect: Mood normal.        Behavior: Behavior normal.        Thought Content: Thought content normal.        Judgment: Judgment normal.     Palliative Assessment/Data: 50%     Thank you for this consult. Palliative medicine will continue to follow and assist holistically.   Time spent includes: Detailed review of medical records (labs, imaging, vital signs), medically appropriate exam (mental status, respiratory, cardiac, skin), discussed with treatment team, counseling and educating patient, family and staff, documenting clinical information, medication management and coordination of care.  Signed by: Lamarr Gunner, DNP, FNP-BC Palliative Medicine   Please contact Palliative Medicine Team providers via [facility] for questions and concerns.

## 2024-08-01 DIAGNOSIS — U071 COVID-19: Secondary | ICD-10-CM | POA: Diagnosis not present

## 2024-08-01 DIAGNOSIS — Z515 Encounter for palliative care: Secondary | ICD-10-CM | POA: Diagnosis not present

## 2024-08-01 DIAGNOSIS — F4310 Post-traumatic stress disorder, unspecified: Secondary | ICD-10-CM | POA: Diagnosis not present

## 2024-08-01 DIAGNOSIS — R531 Weakness: Secondary | ICD-10-CM | POA: Diagnosis not present

## 2024-08-01 LAB — BASIC METABOLIC PANEL WITH GFR
Anion gap: 5 (ref 5–15)
BUN: 15 mg/dL (ref 8–23)
CO2: 28 mmol/L (ref 22–32)
Calcium: 8.1 mg/dL — ABNORMAL LOW (ref 8.9–10.3)
Chloride: 109 mmol/L (ref 98–111)
Creatinine, Ser: 0.62 mg/dL (ref 0.61–1.24)
GFR, Estimated: >60 mL/min (ref >60)
Glucose, Bld: 83 mg/dL (ref 70–99)
Potassium: 4.3 mmol/L (ref 3.5–5.1)
Sodium: 142 mmol/L (ref 135–145)

## 2024-08-01 LAB — MAGNESIUM: Magnesium: 2.1 mg/dL (ref 1.7–2.4)

## 2024-08-01 LAB — CBC
HCT: 34.6 % — ABNORMAL LOW (ref 39.0–52.0)
Hemoglobin: 11.4 g/dL — ABNORMAL LOW (ref 13.0–17.0)
MCH: 32.1 pg (ref 26.0–34.0)
MCHC: 32.9 g/dL (ref 30.0–36.0)
MCV: 97.5 fL (ref 80.0–100.0)
Platelets: 100 K/uL — ABNORMAL LOW (ref 150–400)
RBC: 3.55 MIL/uL — ABNORMAL LOW (ref 4.22–5.81)
RDW: 13.9 % (ref 11.5–15.5)
WBC: 4 K/uL (ref 4.0–10.5)
nRBC: 0 % (ref 0.0–0.2)

## 2024-08-01 LAB — PHOSPHORUS: Phosphorus: 3.1 mg/dL (ref 2.5–4.6)

## 2024-08-01 MED ORDER — DOCUSATE SODIUM 100 MG PO CAPS
100.0000 mg | ORAL_CAPSULE | Freq: Two times a day (BID) | ORAL | Status: DC
  Administered 2024-08-01 – 2024-08-09 (×17): 100 mg via ORAL
  Filled 2024-08-01 (×17): qty 1

## 2024-08-01 MED ORDER — BISACODYL 5 MG PO TBEC
10.0000 mg | DELAYED_RELEASE_TABLET | Freq: Once | ORAL | Status: AC
  Administered 2024-08-01: 10 mg via ORAL
  Filled 2024-08-01: qty 2

## 2024-08-01 MED ORDER — BISACODYL 5 MG PO TBEC
10.0000 mg | DELAYED_RELEASE_TABLET | Freq: Every day | ORAL | Status: DC
  Administered 2024-08-01 – 2024-08-08 (×8): 10 mg via ORAL
  Filled 2024-08-01 (×8): qty 2

## 2024-08-01 MED ORDER — POLYETHYLENE GLYCOL 3350 17 G PO PACK: 8.5000 g | PACK | Freq: Every day | ORAL | Status: DC | PRN

## 2024-08-01 MED ORDER — BISACODYL 10 MG RE SUPP: 10.0000 mg | Freq: Every day | RECTAL | Status: DC | PRN

## 2024-08-01 NOTE — Progress Notes (Signed)
 Palliative Care Progress Note, Assessment & Plan   Patient Name: Rodney Richmond       Date: 08/01/2024 DOB: [birth year]  Age: 78 y.o. MRN#: [id number] Attending Physician: Von Bellis, MD Primary Care Physician: System, Provider Not In Admit Date: 07/29/2024  Subjective: Patient is lying in bed, awake and alert.  He acknowledges my presence and is able to make his wishes known.  He turns 31 today so I wished him a happy birthday.  No family or friends present at bedside during my visit.  HPI: 78 y.o. male  with past medical history of HTN, Parkinson's disease, PTSD, prior brain tumor admitted on 07/29/2024 with generalized weakness and multiple falls.   Patient found to be COVID-positive.  However, patient is not hypoxic or having respiratory symptoms.  Patient is being treated with Paxlovid but no steroids or remdesivir given his use of methotrexate and potentially immunocompromised state.   PMT was consulted to support patient and family with goals of care discussion  Summary of counseling/coordination of care: Extensive chart review completed prior to meeting patient including labs, vital signs, imaging, progress notes, orders, and available advanced directive documents from current and previous encounters.   After reviewing the patient's chart and assessing the patient at bedside, I spoke with patient in regards to symptom management and goals of care.   Symptoms assessed.  Patient continues to endorse that he feels much better than he did when he first came to the hospital.  He denies chest pain, headache, body aches, fever, and other acute issues at this time.  No adjustment to [facility] needed.  Discussed next steps with patient.  He shares that he would like to avoid SNF with rehab but would like to  discuss it with his wife.  Wife plans to be bedside later today.  I again attempted to elicit values and goals important to the patient.  He remains in agreement with full code and full scope, with the caveat that he would never be accepting of tracheostomy or PEG tube placement.  PMT contact info provided for patient.  Advised patient and RN to reach out to PMT when his wife is at bedside and prepared to discuss.  Otherwise, PMT will step back from daily visits and monitor patient peripherally.  Please reengage if patient/family's request, if patient's health deteriorates during hospitalization, or if goals change.  Counseled with attending.  Conveyed above.  Attending in agreement for PMT to step back in shadow patient.  Physical Exam Vitals reviewed.  Constitutional:      General: He is not in acute distress.    Appearance: He is normal weight.  HENT:     Head: Normocephalic.     Mouth/Throat:     Mouth: Mucous membranes are moist.  Eyes:     Pupils: Pupils are equal, round, and reactive to light.  Cardiovascular:     Pulses: Normal pulses.  Pulmonary:     Effort: Pulmonary effort is normal.  Abdominal:     Palpations: Abdomen is soft.  Skin:    General: Skin is warm and dry.  Neurological:     Mental Status: He is alert and oriented to person, place, and time.  Psychiatric:        Mood and Affect: Mood normal.        Behavior: Behavior normal.             Time spent includes: Detailed review of medical records (labs, imaging, vital signs), medically appropriate exam (mental status, respiratory, cardiac, skin), discussed with treatment team, counseling and educating patient, family and staff, documenting clinical information, medication management and coordination of care.  Rodney L. Arvid, DNP, FNP-BC Palliative Medicine Team

## 2024-08-01 NOTE — [place] FL2 (Signed)
 [place]  MEDICAID [id number] LEVEL OF CARE FORM     IDENTIFICATION  Patient Name: Rodney Richmond Birthdate: May 04, 1946 Sex: male Admission Date (Current Location): 07/29/2024  [facility] and [place] Number:  Chiropodist and Address:  [facility], [address], [place], [place] [address]      Provider Number: [id number]  Attending Physician Name and Address:  Von Bellis, MD  Relative Name and Phone Number:  Milano Rosevear [phone]    Current Level of Care: Hospital Recommended Level of Care: Skilled Nursing Facility Prior Approval Number:    Date Approved/Denied:   PASRR Number: Pending pasrr review  Discharge Plan: SNF    Current Diagnoses: Patient Active Problem List   Diagnosis Date Noted   PTSD (post-traumatic stress disorder) 07/29/2024   Hyponatremia 07/29/2024   Fall at home, initial encounter 07/29/2024   COVID-19 07/29/2024   Parkinsonism, unspecified (HCC) 04/24/2023   HLD (hyperlipidemia) 03/10/2023    Orientation RESPIRATION BLADDER Height & Weight     Time, Situation, Self, Place  Normal Incontinent Weight: 182 lb (82.6 kg) Height:  6' (182.9 cm)  BEHAVIORAL SYMPTOMS/MOOD NEUROLOGICAL BOWEL NUTRITION STATUS      Continent Diet (heart healthy diet)  AMBULATORY STATUS COMMUNICATION OF NEEDS Skin   Supervision Verbally Normal                       Personal Care Assistance Level of Assistance  Bathing, Feeding, Dressing Bathing Assistance: Maximum assistance (High Fall Risk) Feeding assistance: Limited assistance Dressing Assistance: Limited assistance     Functional Limitations Info             SPECIAL CARE FACTORS FREQUENCY  PT (By licensed PT), OT (By licensed OT)     PT Frequency: 5x OT Frequency: 3x            Contractures Contractures Info: Not present    Additional Factors Info                  Current Medications (08/01/2024):  This is the current hospital active medication  list Current Facility-Administered Medications  Medication Dose Route Frequency Provider Last Rate Last Admin   acetaminophen  (TYLENOL ) tablet 650 mg  650 mg Oral Q6H PRN Sim Emery CROME, MD       Or   acetaminophen  (TYLENOL ) suppository 650 mg  650 mg Rectal Q6H PRN Sim Emery CROME, MD       apixaban  (ELIQUIS ) tablet 2.5 mg  2.5 mg Oral BID Von Bellis, MD   2.5 mg at 08/01/24 1037   ascorbic acid  (VITAMIN C ) tablet 500 mg  500 mg Oral Daily Von Bellis, MD   500 mg at 08/01/24 1037   bisacodyl  (DULCOLAX) EC tablet 10 mg  10 mg Oral Once Von Bellis, MD       bisacodyl  (DULCOLAX) EC tablet 10 mg  10 mg Oral QHS Von Bellis, MD       bisacodyl  (DULCOLAX) suppository 10 mg  10 mg Rectal Daily PRN Von Bellis, MD       carbidopa -levodopa  (SINEMET  CR) 50-200 MG per tablet controlled release 3 tablet  3 tablet Oral BID Nazari, Walid A, RPH   3 tablet at 08/01/24 0900   And   carbidopa -levodopa  (SINEMET  CR) 50-200 MG per tablet controlled release 2 tablet  2 tablet Oral QHS Nazari, Walid A, RPH   2 tablet at 07/31/24 2213   carbidopa -levodopa  (SINEMET  IR) 25-100 MG per tablet immediate release 1 tablet  1 tablet Oral BID Nada Adriana BIRCH, RPH   1 tablet at 08/01/24 1036   clonazePAM  (KLONOPIN ) tablet 1.5 mg  1.5 mg Oral QHS Von Bellis, MD   1.5 mg at 07/31/24 2212   cyanocobalamin  (VITAMIN B12) tablet 1,000 mcg  1,000 mcg Oral Daily Von Bellis, MD   1,000 mcg at 08/01/24 1037   docusate sodium  (COLACE) capsule 100 mg  100 mg Oral BID Von Bellis, MD       donepezil  (ARICEPT ) tablet 10 mg  10 mg Oral QHS Von Bellis, MD   10 mg at 07/31/24 2213   finasteride  (PROSCAR ) tablet 5 mg  5 mg Oral Daily Von Bellis, MD   5 mg at 08/01/24 1037   fluticasone  (FLONASE ) 50 MCG/ACT nasal spray 1 spray  1 spray Each Nare QHS Mansy, Jan A, MD   1 spray at 07/31/24 2213   gabapentin  (NEURONTIN ) capsule 100 mg  100 mg Oral Daily Von Bellis, MD   100 mg at 08/01/24 1036   iron   polysaccharides (NIFEREX) capsule 150 mg  150 mg Oral Daily Von Bellis, MD   150 mg at 08/01/24 1000   leptospermum manuka honey (MEDIHONEY) paste 1 Application  1 Application Topical Daily Von Bellis, MD   1 Application at 08/01/24 1000   metoprolol  succinate (TOPROL -XL) 24 hr tablet 12.5 mg  12.5 mg Oral Daily Von Bellis, MD   12.5 mg at 08/01/24 1036   ondansetron  (ZOFRAN ) tablet 4 mg  4 mg Oral Q6H PRN Sim Emery CROME, MD       Or   ondansetron  (ZOFRAN ) injection 4 mg  4 mg Intravenous Q6H PRN Sim Emery CROME, MD       Pimavanserin  Tartrate CAPS 34 mg  1 capsule Oral QHS Von Bellis, MD   34 mg at 08/01/24 0007   polyethylene glycol (MIRALAX  / GLYCOLAX ) packet 8.5 g  8.5 g Oral Daily PRN Von Bellis, MD       sodium chloride  (OCEAN) 0.65 % nasal spray 1 spray  1 spray Each Nare PRN Mansy, Jan A, MD       tamsulosin  (FLOMAX ) capsule 0.4 mg  0.4 mg Oral QHS Von Bellis, MD   0.4 mg at 07/31/24 2213   traZODone  (DESYREL ) tablet 100 mg  100 mg Oral QHS Von Bellis, MD   100 mg at 07/31/24 2212   venlafaxine  XR (EFFEXOR -XR) 24 hr capsule 150 mg  150 mg Oral Q breakfast Von Bellis, MD   150 mg at 08/01/24 1036     Discharge Medications: Please see discharge summary for a list of discharge medications.  Relevant Imaging Results:  Relevant Lab Results:   Additional Information SSN [id number]  Alfonso Rummer, LCSW

## 2024-08-01 NOTE — Progress Notes (Signed)
 Occupational Therapy Treatment Patient Details Name: Rodney Richmond MRN: [id number] DOB: 1946-01-11 Today's Date: 08/01/2024   History of present illness 78 y/o male presented to ED on 07/29/24 for fever, weakness, and falls. Tested positive for COVID. PMH: Parkinson's, benign brain tumor, HTN, PTSD   OT comments  Pt seen for OT/PT co-tx this date. Spouse confirms pt's baseline - he is typically able to perform SPT with minimal physical assistance to BSC/wc/recliner and only walks short distances. Pt continues to require significant +2 physical assist for all mobility and ADL efforts. Discussed discharge recommendation with pt/spouse. Pt currently requires MOD A +2 using RW for functional transfers bed <> BSC, stands with MIN A for static standing balance using BUE support on RW, TOTAL A for pericare after small BM smear on BSC. OT will continue to follow, pt will require +2 assist for all ADL/mobility performance at next LOC.       If plan is discharge home, recommend the following:  Two people to help with walking and/or transfers;Two people to help with bathing/dressing/bathroom;Assistance with cooking/housework;Assist for transportation;Help with stairs or ramp for entrance   Equipment Recommendations  Other (comment)       Precautions / Restrictions Precautions Precautions: Fall Restrictions Weight Bearing Restrictions Per Provider Order: No       Mobility Bed Mobility Overal bed mobility: Needs Assistance Bed Mobility: Supine to Sit, Sit to Supine     Supine to sit: Max assist, +2 for physical assistance Sit to supine: Max assist, +2 for physical assistance   General bed mobility comments: assist for trunk and B LE's; vc's for technique    Transfers Overall transfer level: Needs assistance Equipment used: Rolling walker (2 wheels) Transfers: Sit to/from Stand, Bed to chair/wheelchair/BSC Sit to Stand: Mod assist, +2 physical assistance     Step pivot transfers: Mod  assist, +2 physical assistance     General transfer comment: STS x2 with MOD A, poor motor planning and initiation, follows commands with increased time, bed <>BSC     Balance Overall balance assessment: Needs assistance Sitting-balance support: No upper extremity supported, Feet supported Sitting balance-Leahy Scale: Fair Sitting balance - Comments: initially min to mod assist for sitting balance (d/t posterior lean) but improved to CGA with cueing and repositioning   Standing balance support: Bilateral upper extremity supported, During functional activity Standing balance-Leahy Scale: Poor Standing balance comment: min assist for standing balance (static) with B UE support on RW                           ADL either performed or assessed with clinical judgement   ADL Overall ADL's : Needs assistance/impaired                         Toilet Transfer: Moderate assistance;+2 for safety/equipment;+2 for physical assistance;Cueing for safety;Cueing for sequencing;Stand-pivot;BSC/3in1;Rolling walker (2 wheels) Toilet Transfer Details (indicate cue type and reason): significant physical assist compared to baseline; pt with poor motor planning and initiation Toileting- Clothing Manipulation and Hygiene: Total assistance;Sit to/from stand Toileting - Clothing Manipulation Details (indicate cue type and reason): requires MIN A for static standing support using RW, TOTAL A for pericare after BM smear     Functional mobility during ADLs: Maximal assistance;+2 for physical assistance;+2 for safety/equipment;Cueing for safety;Cueing for sequencing;Rolling walker (2 wheels)       Communication Communication Communication: Impaired Factors Affecting Communication: Reduced clarity of speech  Cognition Arousal: Alert Behavior During Therapy: Flat affect                                 Following commands: Intact, Impaired Following commands impaired: Follows  one step commands with increased time      Cueing   Cueing Techniques: Verbal cues        General Comments Sitting on BSC pt's BP 146/86 with HR 78 bpm    Pertinent Vitals/ Pain       Pain Assessment Pain Assessment: No/denies pain   Frequency  Min 1X/week        Progress Toward Goals  OT Goals(current goals can now be found in the care plan section)  Progress towards OT goals: Progressing toward goals  Acute Rehab OT Goals OT Goal Formulation: With patient Time For Goal Achievement: 08/13/24 Potential to Achieve Goals: Fair ADL Goals Pt Will Perform Grooming: with supervision;standing Pt Will Perform Lower Body Dressing: with min assist;sit to/from stand Pt Will Transfer to Toilet: with min assist;stand pivot transfer Pt Will Perform Toileting - Clothing Manipulation and hygiene: with min assist;sit to/from stand  Plan      Co-evaluation    PT/OT/SLP Co-Evaluation/Treatment: Yes Reason for Co-Treatment: For patient/therapist safety;To address functional/ADL transfers;Complexity of the patient's impairments (multi-system involvement) PT goals addressed during session: Mobility/safety with mobility;Balance;Proper use of DME OT goals addressed during session: ADL's and self-care      AM-PAC OT 6 Clicks Daily Activity     Outcome Measure   Help from another person eating meals?: A Little Help from another person taking care of personal grooming?: A Little Help from another person toileting, which includes using toliet, bedpan, or urinal?: A Lot Help from another person bathing (including washing, rinsing, drying)?: A Lot Help from another person to put on and taking off regular upper body clothing?: A Lot Help from another person to put on and taking off regular lower body clothing?: A Lot 6 Click Score: 14    End of Session Equipment Utilized During Treatment: Gait belt;Rolling walker (2 wheels)  OT Visit Diagnosis: Unsteadiness on feet (R26.81);Repeated  falls (R29.6);Muscle weakness (generalized) (M62.81)   Activity Tolerance Patient tolerated treatment well   Patient Left in bed;with call bell/phone within reach;with bed alarm set;with family/visitor present   Nurse Communication Mobility status        Time: 1340-1429 OT Time Calculation (min): 49 min  Charges: OT General Charges $OT Visit: 1 Visit OT Treatments $Self Care/Home Management : 8-22 mins  Bular Hickok L. Imaad Reuss, OTR/L  08/01/24, 5:28 PM

## 2024-08-01 NOTE — TOC CM/SW Note (Signed)
 Transition of Care [facility]) - Inpatient Brief Assessment   Patient Details  Name: Rodney Richmond MRN: [id number] Date of Birth: 01/30/1946  [facility] CM/SW Contact:    Alfonso Rummer, LCSW Phone Number: 08/01/2024, 3:36 PM   Clinical Narrative:  LCSW A. Rummer completed chart review and [employer] FL2 form. Pt spouse agreed to SNF placement. Pt spouse reports preferences are [facility] and [facility]. LCSW A. Rummer will send out bed search and will continue follow up with [name].   Transition of Care Asessment: Insurance and Status: Insurance coverage has been reviewed ([employer] and [employer])   Home environment has been reviewed: Lives with spouse in single family home   Prior/Current Home Services: No current home services Social Drivers of Health Review: SDOH reviewed no interventions necessary Readmission risk has been reviewed: No Transition of care needs: transition of care needs identified, TOC will continue to follow

## 2024-08-01 NOTE — Progress Notes (Addendum)
 [facility] Progress Note  Patient: Rodney Richmond    FMW:[id number]  DOA: 07/29/2024     Date of Service: the patient was seen and examined on 08/01/2024  Chief Complaint  Patient presents with   Fever   Brief hospital course: Rodney Richmond is a 78 y.o. male with medical history significant of essential hypertension, Parkinson's disease, PTSD, history of prior brain tumor who was brought in by Rodney Richmond secondary to generalized weakness and multiple falls at home.  Patient had at least 3 episode of falling at home today where he slumped on the floor.  EMS was called all 3 times.  Ultimately after the third episode patient was brought to the ER with generalized weakness and fall.  There is associated fever and weakness.  Rodney Richmond believe patient had exposure to the flu.  Wife at bedside.  On arrival his temperature is 101.9 and heart rate 101-105.  He is 98% on room air and blood pressure was stable.  His respiratory rate however was 28.  Patient had workup for infectious sources and so far COVID-19 was positive.  Patient suspected to have acute illness with generalized weakness and fall as a result of COVID-19 infection.  He is not having any respiratory symptoms.  Not hypoxic.  Patient will be admitted for further supportive care due to failure to thrive at home and recurrent falling.    Assessment and Plan:  # COVID-19 infection: Patient has no respiratory symptoms but appears to be dehydrated, weak, sepsis ruled out.    Continue supportive care.  He was taking methotrexate so he is relatively immunocompromised.  He will have been a good candidate for Paxlovid.  No steroids and no remdesivir.  Patient has no respiratory symptoms and not hypoxic.     # hyponatremia: Possible due to nutritional deficiency and dehydration. S/p IVF, sodium improved.  # hyperlipidemia: Continue statin CK 158 wnl   # Parkinson's disease: Confirmed on resume home regimen.  Continue to monitor   # PTSD:  Continue home regimen.   # Iron  deficiency, Tsat 11% Venofer  300 mg IV one-time dose given, continue oral iron  supplement and follow with PCP.  # h/o LLE on Eliquis .  Patient is following vascular surgery as an outpatient who will let her mind when to discontinue DOAC  # s/p Multiple falls at home: Probably from infection as a result of COVID-19.  Patient is overall weak.   Debility and deconditioning, PT and OT evaluation done, recommended SNF placement Vitamin D  level within normal range Vitamin B12 level 376, goal >400, started vitamin B12 oral supplement.  Repeat B12 level after 3 to 6 months.  Follow with PCP.   Body mass index is 24.68 kg/m.  Interventions:  Diet: Heart healthy diet DVT Prophylaxis: Eliquis   Advance goals of care discussion: Full code  Rodney Richmond Communication: Rodney Richmond was not present at bedside, at the time of interview.  The pt provided permission to discuss medical plan with the Rodney Richmond. Opportunity was given to ask question and all questions were answered satisfactorily.   Disposition:  Pt is from Home, admitted with Fall and COVID, still has risk of fall, which precludes a safe discharge. Discharge to SNF, when bed will be available. TOC involved for discharge plan, to start searching for SNF   Subjective: No significant events overnight, patient is feeling improvement, denies any specific complaints, no chest pain or palpitation, no shortness of breath. Patient still generalized weakness, will benefit from SNF placement for physical therapy.  Physical Exam: General: NAD, lying comfortably Appear in no distress, affect appropriate Eyes: PERRLA ENT: Oral Mucosa Clear, moist  Neck: no JVD,  Cardiovascular: S1 and S2 Present, no Murmur,  Respiratory: good respiratory effort, Bilateral Air entry equal and Decreased, no Crackles, no wheezes Abdomen: Bowel Sound present, Soft and no tenderness,  Skin: no rashes Extremities: no Pedal edema, no calf  tenderness Neurologic: without any new focal findings Gait not checked due to patient safety concerns  Vitals:   07/31/24 1449 07/31/24 2007 08/01/24 0815 08/01/24 1506  BP: 131/75 (!) 155/86 (!) 156/83 136/87  Pulse: 67 66 63 81  Resp: 17 16 16 16   Temp: 98.2 F (36.8 C) 97.9 F (36.6 C)  98.1 F (36.7 C)  TempSrc: Oral Oral  Oral  SpO2: 97% 97% 98%   Weight:      Height:        Intake/Output Summary (Last 24 hours) at 08/01/2024 1531 Last data filed at 08/01/2024 9173 Gross per 24 hour  Intake 220 ml  Output 1200 ml  Net -980 ml   Filed Weights   07/29/24 1858  Weight: 82.6 kg    Data Reviewed: I have personally reviewed and interpreted daily labs, tele strips, imagings as discussed above. I reviewed all nursing notes, pharmacy notes, vitals, pertinent old records I have discussed plan of care as described above with RN and patient/Rodney Richmond.  CBC: Recent Labs  Lab 07/29/24 1908 07/30/24 1504 07/31/24 0518 08/01/24 0449  WBC 3.6* 3.0* 4.0 4.0  NEUTROABS 2.8  --   --   --   HGB 11.4* 10.8* 11.7* 11.4*  HCT 34.1* 33.4* 35.1* 34.6*  MCV 98.0 98.8 97.8 97.5  PLT 92* 81* 84* 100*   Basic Metabolic Panel: Recent Labs  Lab 07/29/24 1908 07/30/24 0238 07/31/24 0518 08/01/24 0449  NA 134* 139 142 142  K 4.0 4.4 3.9 4.3  CL 103 108 108 109  CO2 25 28 28 28   GLUCOSE 100* 88 80 83  BUN 19 16 15 15   CREATININE 0.71 0.68 0.87 0.62  CALCIUM 7.9* 8.2* 8.1* 8.1*  MG  --  2.1 2.1 2.1  PHOS  --  2.5 2.8 3.1    Studies: No results found.   Scheduled Meds:  apixaban   2.5 mg Oral BID   vitamin C   500 mg Oral Daily   bisacodyl   10 mg Oral Once   bisacodyl   10 mg Oral QHS   carbidopa -levodopa   3 tablet Oral BID   And   carbidopa -levodopa   2 tablet Oral QHS   carbidopa -levodopa   1 tablet Oral BID   clonazePAM   1.5 mg Oral QHS   vitamin B-12  1,000 mcg Oral Daily   docusate sodium   100 mg Oral BID   donepezil   10 mg Oral QHS   finasteride   5 mg Oral Daily    fluticasone   1 spray Each Nare QHS   gabapentin   100 mg Oral Daily   iron  polysaccharides  150 mg Oral Daily   leptospermum manuka honey  1 Application Topical Daily   metoprolol  succinate  12.5 mg Oral Daily   Pimavanserin  Tartrate  1 capsule Oral QHS   tamsulosin   0.4 mg Oral QHS   traZODone   100 mg Oral QHS   venlafaxine  XR  150 mg Oral Q breakfast   Continuous Infusions:   PRN Meds: acetaminophen  **OR** acetaminophen , bisacodyl , ondansetron  **OR** ondansetron  (ZOFRAN ) IV, polyethylene glycol, sodium chloride   Time spent: 35 minutes  Author: ELVAN SOR. MD  [facility] 08/01/2024 3:31 PM  To reach On-call, see care teams to locate the attending and reach out to them via [email or web address]. If 7PM-7AM, please contact night-coverage If you still have difficulty reaching the attending provider, please page the [facility] (Director on Call) for [facility] on amion for assistance.

## 2024-08-01 NOTE — Progress Notes (Signed)
 Physical Therapy Treatment Patient Details Name: Rodney Richmond MRN: [id number] DOB: 1945/11/27 Today's Date: 08/01/2024   History of Present Illness 78 y/o male presented to ED on 07/29/24 for fever, weakness, and falls. Tested positive for COVID. PMH: Parkinson's, benign brain tumor, HTN, PTSD    PT Comments  PT/OT co-treatment performed.  Pt's wife reports pt usually performs w/c level transfers with RW and 1 assist (pt's wife holds onto his arm); pt's wife requesting to try pt on [facility] in order to have BM.  Pt sleeping in bed upon therapy arrival but was alert once woken up.  During session pt was max assist x2 with bed mobility; decreased assist required with sitting balance with cueing and repositioning (d/t posterior lean initially); and mod assist x2 for transfers bed to/from [facility] with RW use.  No c/o dizziness/lightheadedness during session although pt did report feeling weak in standing limiting standing time/activities.  Pt assisted back to bed end of session and repositioned to improve comfort.  Will continue to focus on strengthening, balance, and improving transfers during hospitalization.   If plan is discharge home, recommend the following: Two people to help with walking and/or transfers;A lot of help with bathing/dressing/bathroom;Assistance with cooking/housework;Assist for transportation;Help with stairs or ramp for entrance;Direct supervision/assist for medications management;Direct supervision/assist for financial management;Assistance with feeding   Can travel by private vehicle     No  Equipment Recommendations  Other (comment) (TBD at next facility)    Recommendations for Other Services       Precautions / Restrictions Precautions Precautions: Fall Restrictions Weight Bearing Restrictions Per Provider Order: No     Mobility  Bed Mobility Overal bed mobility: Needs Assistance Bed Mobility: Supine to Sit, Sit to Supine     Supine to sit: Max assist, +2 for  physical assistance Sit to supine: Max assist, +2 for physical assistance   General bed mobility comments: assist for trunk and B LE's; vc's for technique    Transfers Overall transfer level: Needs assistance Equipment used: Rolling walker (2 wheels) Transfers: Sit to/from Stand, Bed to chair/wheelchair/BSC Sit to Stand: Mod assist, +2 physical assistance   Step pivot transfers: Mod assist, +2 physical assistance       General transfer comment: mod assist x2 to stand (x1 trial from bed and x1 trial from [facility]) and perform stand step turn bed to/from [facility] with RW use; increased effort/time to take steps; assist for weight shift; pt with shuffling gait    Ambulation/Gait               General Gait Details: Deferred d/t generalized weakness (pt's wife reports pt only walks a few feet at most with RW)   Stairs             Wheelchair Mobility     Tilt Bed    Modified Rankin (Stroke Patients Only)       Balance Overall balance assessment: Needs assistance Sitting-balance support: No upper extremity supported, Feet supported Sitting balance-Leahy Scale: Fair Sitting balance - Comments: initially min to mod assist for sitting balance (d/t posterior lean) but improved to CGA with cueing and repositioning   Standing balance support: Bilateral upper extremity supported, During functional activity Standing balance-Leahy Scale: Poor Standing balance comment: min assist for standing balance (static) with B UE support on RW                            Communication Communication Communication: Impaired  Factors Affecting Communication: Reduced clarity of speech  Cognition Arousal: Alert (Sleeping upon arrival but alert once woken up) Behavior During Therapy: Flat affect                           PT - Cognition Comments: Difficult to understand at times due to reduced clarity and volume of speech Following commands: Intact, Impaired Following  commands impaired: Follows one step commands with increased time    Cueing Cueing Techniques: Verbal cues  Exercises      General Comments General comments (skin integrity, edema, etc.): Sitting on BSC pt's BP 146/86 with HR 78 bpm.  Nursing cleared pt for participation in physical therapy.  Pt agreeable to PT session.  Pt's wife present entire session.      Pertinent Vitals/Pain Pain Assessment Pain Assessment: No/denies pain    Home Living                          Prior Function            PT Goals (current goals can now be found in the care plan section) Acute Rehab PT Goals Patient Stated Goal: to improve strength and transfers in order to go home PT Goal Formulation: With patient/family Time For Goal Achievement: 08/13/24 Potential to Achieve Goals: Fair Progress towards PT goals: Progressing toward goals    Frequency    Min 1X/week      PT Plan      Co-evaluation   Reason for Co-Treatment: For patient/therapist safety;To address functional/ADL transfers;Complexity of the patient's impairments (multi-system involvement) PT goals addressed during session: Mobility/safety with mobility;Balance;Proper use of DME OT goals addressed during session: ADL's and self-care      AM-PAC PT 6 Clicks Mobility   Outcome Measure  Help needed turning from your back to your side while in a flat bed without using bedrails?: A Lot Help needed moving from lying on your back to sitting on the side of a flat bed without using bedrails?: Total Help needed moving to and from a bed to a chair (including a wheelchair)?: Total Help needed standing up from a chair using your arms (e.g., wheelchair or bedside chair)?: Total Help needed to walk in hospital room?: Total Help needed climbing 3-5 steps with a railing? : Total 6 Click Score: 7    End of Session Equipment Utilized During Treatment: Gait belt Activity Tolerance: Patient tolerated treatment well;Patient limited  by fatigue Patient left: in bed;with call bell/phone within reach;with bed alarm set;with family/visitor present Nurse Communication: Mobility status;Precautions;Other (comment) (Pt noted without IV access upon therapy arrival (nurse already aware); pt unable to have BM on [facility]) PT Visit Diagnosis: Unsteadiness on feet (R26.81);Muscle weakness (generalized) (M62.81);Other abnormalities of gait and mobility (R26.89)     Time: 8659-8571 PT Time Calculation (min) (ACUTE ONLY): 48 min  Charges:    $Therapeutic Activity: 23-37 mins PT General Charges $$ ACUTE PT VISIT: 1 Visit                     Damien Caulk, PT 08/01/24, 4:45 PM

## 2024-08-01 NOTE — Plan of Care (Signed)
°  Problem: Education: Goal: Knowledge of General Education information will improve Description: Including pain rating scale, medication(s)/side effects and non-pharmacologic comfort measures Outcome: Progressing   Problem: Clinical Measurements: Goal: Ability to maintain clinical measurements within normal limits will improve Outcome: Progressing Goal: Will remain free from infection Outcome: Progressing Goal: Diagnostic test results will improve Outcome: Progressing   Problem: Nutrition: Goal: Adequate nutrition will be maintained Outcome: Progressing

## 2024-08-01 NOTE — Plan of Care (Signed)

## 2024-08-02 DIAGNOSIS — U071 COVID-19: Secondary | ICD-10-CM | POA: Diagnosis not present

## 2024-08-02 LAB — MAGNESIUM: Magnesium: 2.2 mg/dL (ref 1.7–2.4)

## 2024-08-02 LAB — CBC
HCT: 36.5 % — ABNORMAL LOW (ref 39.0–52.0)
Hemoglobin: 12.2 g/dL — ABNORMAL LOW (ref 13.0–17.0)
MCH: 32 pg (ref 26.0–34.0)
MCHC: 33.4 g/dL (ref 30.0–36.0)
MCV: 95.8 fL (ref 80.0–100.0)
Platelets: 124 K/uL — ABNORMAL LOW (ref 150–400)
RBC: 3.81 MIL/uL — ABNORMAL LOW (ref 4.22–5.81)
RDW: 13.9 % (ref 11.5–15.5)
WBC: 4.3 K/uL (ref 4.0–10.5)
nRBC: 0 % (ref 0.0–0.2)

## 2024-08-02 LAB — BASIC METABOLIC PANEL WITH GFR
Anion gap: 4 — ABNORMAL LOW (ref 5–15)
BUN: 18 mg/dL (ref 8–23)
CO2: 29 mmol/L (ref 22–32)
Calcium: 8.2 mg/dL — ABNORMAL LOW (ref 8.9–10.3)
Chloride: 111 mmol/L (ref 98–111)
Creatinine, Ser: 0.67 mg/dL (ref 0.61–1.24)
GFR, Estimated: >60 mL/min (ref >60)
Glucose, Bld: 85 mg/dL (ref 70–99)
Potassium: 4.4 mmol/L (ref 3.5–5.1)
Sodium: 144 mmol/L (ref 135–145)

## 2024-08-02 LAB — PHOSPHORUS: Phosphorus: 2.9 mg/dL (ref 2.5–4.6)

## 2024-08-02 MED ORDER — PANTOPRAZOLE SODIUM 40 MG PO TBEC
40.0000 mg | DELAYED_RELEASE_TABLET | Freq: Two times a day (BID) | ORAL | Status: DC
  Administered 2024-08-02 – 2024-08-09 (×15): 40 mg via ORAL
  Filled 2024-08-02 (×15): qty 1

## 2024-08-02 NOTE — TOC Progression Note (Signed)
 Transition of Care [facility]) - Progression Note    Patient Details  Name: Rodney Richmond MRN: [id number] Date of Birth: [birth year]  Transition of Care [facility] CM/SW Contact  Alfonso Rummer, LCSW Phone Number: 08/02/2024, 4:37 PM  Clinical Narrative:     KEN DELENA Rummer completed veteran administration request for service form. Pt accepts bed offer at [facility]. Pasrr review is still pending at this time.                     Expected Discharge Plan and Services                                               Social Drivers of Health (SDOH) Interventions SDOH Screenings   Food Insecurity: No Food Insecurity (07/30/2024)  Housing: Low Risk  (07/30/2024)  Transportation Needs: No Transportation Needs (07/30/2024)  Utilities: Not At Risk (07/30/2024)  Financial Resource Strain: Low Risk  (01/26/2023)   Received from [facility]  Social Connections: Moderately Isolated (07/30/2024)  Tobacco Use: Medium Risk (07/29/2024)    Readmission Risk Interventions     No data to display

## 2024-08-02 NOTE — Plan of Care (Signed)

## 2024-08-02 NOTE — Progress Notes (Signed)
 RE: NORMON PETTIJOHN Date of Birth: August 08, 1946 Date: 08/02/2024     To Whom It May Concern:   Please be advised that the above-named patient will require a short-term nursing home stay - anticipated 30 days or less for rehabilitation and strengthening.  The plan is for return home

## 2024-08-02 NOTE — Progress Notes (Signed)
 [facility] Progress Note  Patient: Rodney Richmond    FMW:[id number]  DOA: 07/29/2024     Date of Service: the patient was seen and examined on 08/02/2024  Chief Complaint  Patient presents with   Fever   Brief hospital course: Rodney Richmond is a 78 y.o. male with medical history significant of essential hypertension, Parkinson's disease, PTSD, history of prior brain tumor who was brought in by family secondary to generalized weakness and multiple falls at home.  Patient had at least 3 episode of falling at home today where he slumped on the floor.  EMS was called all 3 times.  Ultimately after the third episode patient was brought to the ER with generalized weakness and fall.  There is associated fever and weakness.  Family believe patient had exposure to the flu.  Wife at bedside.  On arrival his temperature is 101.9 and heart rate 101-105.  He is 98% on room air and blood pressure was stable.  His respiratory rate however was 28.  Patient had workup for infectious sources and so far COVID-19 was positive.  Patient suspected to have acute illness with generalized weakness and fall as a result of COVID-19 infection.  He is not having any respiratory symptoms.  Not hypoxic.  Patient will be admitted for further supportive care due to failure to thrive at home and recurrent falling.    Assessment and Plan:  # COVID-19 infection: Patient has no respiratory symptoms but appears to be dehydrated, weak, sepsis ruled out.    Continue supportive care.  He was taking methotrexate so he is relatively immunocompromised.  He will have been a good candidate for Paxlovid.  No steroids and no remdesivir.  Patient has no respiratory symptoms and not hypoxic.     # hyponatremia: Possible due to nutritional deficiency and dehydration. S/p IVF, sodium improved.  # hyperlipidemia: Continue statin CK 158 wnl   # Parkinson's disease: Confirmed on resume home regimen.  Continue to monitor   # PTSD:  Continue home regimen.   # Iron  deficiency, Tsat 11% Venofer  300 mg IV one-time dose given, continue oral iron  supplement and follow with PCP.  # h/o LLE on Eliquis .  Patient is following vascular surgery as an outpatient who will let her mind when to discontinue DOAC  # s/p Multiple falls at home: Probably from infection as a result of COVID-19.  Patient is overall weak.   Debility and deconditioning, PT and OT evaluation done, recommended SNF placement Vitamin D  level within normal range  # Vitamin B12 level 376, goal >400, started vitamin B12 oral supplement.  Repeat B12 level after 3 to 6 months.  Follow with PCP.   Body mass index is 24.68 kg/m.  Interventions:  Diet: Heart healthy diet DVT Prophylaxis: Eliquis   Advance goals of care discussion: Full code  Family Communication: family was not present at bedside, at the time of interview.  The pt provided permission to discuss medical plan with the family. Opportunity was given to ask question and all questions were answered satisfactorily.   Disposition:  Pt is from Home, admitted with Fall and COVID, still has risk of fall, which precludes a safe discharge. Discharge to SNF, when bed will be available. TOC involved for discharge plan, pending SNF placement   Subjective: No significant events overnight, patient was lying comfortably in the recliner. Patient is feeling generalized weakness and tired and fatigue, no any specific complaints.    Physical Exam: General: NAD, lying comfortably Appear  in no distress, affect appropriate Eyes: PERRLA ENT: Oral Mucosa Clear, moist  Neck: no JVD,  Cardiovascular: S1 and S2 Present, no Murmur,  Respiratory: good respiratory effort, Bilateral Air entry equal and Decreased, no Crackles, no wheezes Abdomen: Bowel Sound present, Soft and no tenderness,  Skin: no rashes Extremities: no Pedal edema, no calf tenderness Neurologic: without any new focal findings Gait not checked due  to patient safety concerns  Vitals:   08/01/24 0815 08/01/24 1506 08/01/24 2132 08/02/24 0417  BP: (!) 156/83 136/87 132/84 (!) 151/68  Pulse: 63 81 76 (!) 59  Resp: 16 16 16 16   Temp:  98.1 F (36.7 C) 98.1 F (36.7 C) 98 F (36.7 C)  TempSrc:  Oral    SpO2: 98%  97% 98%  Weight:      Height:        Intake/Output Summary (Last 24 hours) at 08/02/2024 1453 Last data filed at 08/02/2024 0100 Gross per 24 hour  Intake --  Output 500 ml  Net -500 ml   Filed Weights   07/29/24 1858  Weight: 82.6 kg    Data Reviewed: I have personally reviewed and interpreted daily labs, tele strips, imagings as discussed above. I reviewed all nursing notes, pharmacy notes, vitals, pertinent old records I have discussed plan of care as described above with RN and patient/family.  CBC: Recent Labs  Lab 07/29/24 1908 07/30/24 1504 07/31/24 0518 08/01/24 0449 08/02/24 0602  WBC 3.6* 3.0* 4.0 4.0 4.3  NEUTROABS 2.8  --   --   --   --   HGB 11.4* 10.8* 11.7* 11.4* 12.2*  HCT 34.1* 33.4* 35.1* 34.6* 36.5*  MCV 98.0 98.8 97.8 97.5 95.8  PLT 92* 81* 84* 100* 124*   Basic Metabolic Panel: Recent Labs  Lab 07/29/24 1908 07/30/24 0238 07/31/24 0518 08/01/24 0449 08/02/24 0602  NA 134* 139 142 142 144  K 4.0 4.4 3.9 4.3 4.4  CL 103 108 108 109 111  CO2 25 28 28 28 29   GLUCOSE 100* 88 80 83 85  BUN 19 16 15 15 18   CREATININE 0.71 0.68 0.87 0.62 0.67  CALCIUM 7.9* 8.2* 8.1* 8.1* 8.2*  MG  --  2.1 2.1 2.1 2.2  PHOS  --  2.5 2.8 3.1 2.9    Studies: No results found.   Scheduled Meds:  apixaban   2.5 mg Oral BID   vitamin C   500 mg Oral Daily   bisacodyl   10 mg Oral QHS   carbidopa -levodopa   3 tablet Oral BID   And   carbidopa -levodopa   2 tablet Oral QHS   carbidopa -levodopa   1 tablet Oral BID   clonazePAM   1.5 mg Oral QHS   vitamin B-12  1,000 mcg Oral Daily   docusate sodium   100 mg Oral BID   donepezil   10 mg Oral QHS   finasteride   5 mg Oral Daily   fluticasone   1 spray  Each Nare QHS   gabapentin   100 mg Oral Daily   iron  polysaccharides  150 mg Oral Daily   leptospermum manuka honey  1 Application Topical Daily   metoprolol  succinate  12.5 mg Oral Daily   pantoprazole   40 mg Oral BID   Pimavanserin  Tartrate  1 capsule Oral QHS   tamsulosin   0.4 mg Oral QHS   traZODone   100 mg Oral QHS   venlafaxine  XR  150 mg Oral Q breakfast   Continuous Infusions:   PRN Meds: acetaminophen  **OR** acetaminophen , bisacodyl , ondansetron  **OR** ondansetron  (ZOFRAN )  IV, polyethylene glycol, sodium chloride   Time spent: 35 minutes  Author: ELVAN SOR. MD [facility] 08/02/2024 2:53 PM  To reach On-call, see care teams to locate the attending and reach out to them via [email or web address]. If 7PM-7AM, please contact night-coverage If you still have difficulty reaching the attending provider, please page the [facility] (Director on Call) for [facility] on amion for assistance.

## 2024-08-03 DIAGNOSIS — U071 COVID-19: Secondary | ICD-10-CM | POA: Diagnosis not present

## 2024-08-03 LAB — CULTURE, BLOOD (ROUTINE X 2): Culture: NO GROWTH

## 2024-08-03 LAB — GLUCOSE, CAPILLARY: Glucose-Capillary: 79 mg/dL (ref 70–99)

## 2024-08-03 NOTE — Progress Notes (Signed)
 [facility] Progress Note  Patient: Rodney Richmond    FMW:[id number]  DOA: 07/29/2024     Date of Service: the patient was seen and examined on 08/03/2024  Chief Complaint  Patient presents with   Fever   Brief hospital course: Rodney Richmond is a 78 y.o. male with medical history significant of essential hypertension, Parkinson's disease, PTSD, history of prior brain tumor who was brought in by family secondary to generalized weakness and multiple falls at home.  Patient had at least 3 episode of falling at home today where he slumped on the floor.  EMS was called all 3 times.  Ultimately after the third episode patient was brought to the ER with generalized weakness and fall.  There is associated fever and weakness.  Family believe patient had exposure to the flu.  Wife at bedside.  On arrival his temperature is 101.9 and heart rate 101-105.  He is 98% on room air and blood pressure was stable.  His respiratory rate however was 28.  Patient had workup for infectious sources and so far COVID-19 was positive.  Patient suspected to have acute illness with generalized weakness and fall as a result of COVID-19 infection.  He is not having any respiratory symptoms.  Not hypoxic.  Patient will be admitted for further supportive care due to failure to thrive at home and recurrent falling.    Assessment and Plan:  # COVID-19 infection: Patient has no respiratory symptoms but appears to be dehydrated, weak, sepsis ruled out.    Continue supportive care.  He was taking methotrexate so he is relatively immunocompromised.  He will have been a good candidate for Paxlovid.  No steroids and no remdesivir.  Patient has no respiratory symptoms and not hypoxic.     # hyponatremia: Possible due to nutritional deficiency and dehydration. S/p IVF, sodium improved.  # hyperlipidemia: Continue statin CK 158 wnl   # Parkinson's disease: Confirmed on resume home regimen.  Continue to monitor   # PTSD:  Continue home regimen.   # Iron  deficiency, Tsat 11% Venofer  300 mg IV one-time dose given, continue oral iron  supplement and follow with PCP.  # h/o LLE on Eliquis .  Patient is following vascular surgery as an outpatient who will let her mind when to discontinue DOAC  # s/p Multiple falls at home: Probably from infection as a result of COVID-19.  Patient is overall weak.   Debility and deconditioning, PT and OT evaluation done, recommended SNF placement Vitamin D  level within normal range  # Vitamin B12 level 376, goal >400, started vitamin B12 oral supplement.  Repeat B12 level after 3 to 6 months.  Follow with PCP.   Body mass index is 24.68 kg/m.  Interventions:  Diet: Heart healthy diet DVT Prophylaxis: Eliquis   Advance goals of care discussion: Full code  Family Communication: family was not present at bedside, at the time of interview.  The pt provided permission to discuss medical plan with the family. Opportunity was given to ask question and all questions were answered satisfactorily.   Disposition:  Pt is from Home, admitted with Fall and COVID, still has risk of fall, which precludes a safe discharge. Discharge to SNF, when bed will be available. TOC involved for discharge plan, pending SNF placement   Subjective: No significant events overnight, patient was sleeping comfortably, stated that he is sleepy today.  Still feels weak and tired, denied any other complaints. Patient is not able to get up by himself out  of the bed, needs help. Patient agreed for SNF placement.   Physical Exam: General: NAD, lying comfortably Appear in no distress, affect appropriate Eyes: PERRLA ENT: Oral Mucosa Clear, moist  Neck: no JVD,  Cardiovascular: S1 and S2 Present, no Murmur,  Respiratory: good respiratory effort, Bilateral Air entry equal and Decreased, no Crackles, no wheezes Abdomen: Bowel Sound present, Soft and no tenderness,  Skin: no rashes Extremities: no Pedal  edema, no calf tenderness Neurologic: without any new focal findings Gait not checked due to patient safety concerns  Vitals:   08/02/24 1817 08/02/24 2107 08/03/24 0449 08/03/24 0821  BP: (!) 142/102 (!) 154/81 (!) 141/79 126/70  Pulse: 76 72 73 65  Resp: 16 20 16 18   Temp: 97.7 F (36.5 C) 97.8 F (36.6 C) 98.8 F (37.1 C) 97.7 F (36.5 C)  TempSrc: Oral     SpO2: 99% 94% 100% 98%  Weight:      Height:        Intake/Output Summary (Last 24 hours) at 08/03/2024 1319 Last data filed at 08/03/2024 1100 Gross per 24 hour  Intake 240 ml  Output 1700 ml  Net -1460 ml   Filed Weights   07/29/24 1858  Weight: 82.6 kg    Data Reviewed: I have personally reviewed and interpreted daily labs, tele strips, imagings as discussed above. I reviewed all nursing notes, pharmacy notes, vitals, pertinent old records I have discussed plan of care as described above with RN and patient/family.  CBC: Recent Labs  Lab 07/29/24 1908 07/30/24 1504 07/31/24 0518 08/01/24 0449 08/02/24 0602  WBC 3.6* 3.0* 4.0 4.0 4.3  NEUTROABS 2.8  --   --   --   --   HGB 11.4* 10.8* 11.7* 11.4* 12.2*  HCT 34.1* 33.4* 35.1* 34.6* 36.5*  MCV 98.0 98.8 97.8 97.5 95.8  PLT 92* 81* 84* 100* 124*   Basic Metabolic Panel: Recent Labs  Lab 07/29/24 1908 07/30/24 0238 07/31/24 0518 08/01/24 0449 08/02/24 0602  NA 134* 139 142 142 144  K 4.0 4.4 3.9 4.3 4.4  CL 103 108 108 109 111  CO2 25 28 28 28 29   GLUCOSE 100* 88 80 83 85  BUN 19 16 15 15 18   CREATININE 0.71 0.68 0.87 0.62 0.67  CALCIUM 7.9* 8.2* 8.1* 8.1* 8.2*  MG  --  2.1 2.1 2.1 2.2  PHOS  --  2.5 2.8 3.1 2.9    Studies: No results found.   Scheduled Meds:  apixaban   2.5 mg Oral BID   vitamin C   500 mg Oral Daily   bisacodyl   10 mg Oral QHS   carbidopa -levodopa   3 tablet Oral BID   And   carbidopa -levodopa   2 tablet Oral QHS   carbidopa -levodopa   1 tablet Oral BID   clonazePAM   1.5 mg Oral QHS   vitamin B-12  1,000 mcg Oral  Daily   docusate sodium   100 mg Oral BID   donepezil   10 mg Oral QHS   finasteride   5 mg Oral Daily   fluticasone   1 spray Each Nare QHS   gabapentin   100 mg Oral Daily   iron  polysaccharides  150 mg Oral Daily   leptospermum manuka honey  1 Application Topical Daily   metoprolol  succinate  12.5 mg Oral Daily   pantoprazole   40 mg Oral BID   Pimavanserin  Tartrate  1 capsule Oral QHS   tamsulosin   0.4 mg Oral QHS   traZODone   100 mg Oral QHS   venlafaxine   XR  150 mg Oral Q breakfast   Continuous Infusions:   PRN Meds: acetaminophen  **OR** acetaminophen , bisacodyl , ondansetron  **OR** ondansetron  (ZOFRAN ) IV, polyethylene glycol, sodium chloride   Time spent: 35 minutes  Author: ELVAN SOR. MD [facility] 08/03/2024 1:19 PM  To reach On-call, see care teams to locate the attending and reach out to them via [email or web address]. If 7PM-7AM, please contact night-coverage If you still have difficulty reaching the attending provider, please page the [facility] (Director on Call) for [facility] on amion for assistance.

## 2024-08-03 NOTE — TOC Progression Note (Signed)
 Transition of Care [facility]) - Progression Note    Patient Details  Name: Rodney Richmond MRN: [id number] Date of Birth: 1945-12-08  [facility]) CM/SW Contact  [place] Julian[place], [place] Phone Number: 08/03/2024, 9:51 AM  Clinical Narrative:     CSW e-mail ([email or web address]) referral packet to the [facility] Referral Authorization Team.                     Expected Discharge Plan and Services                                               Social Drivers of Health (SDOH) Interventions SDOH Screenings   Food Insecurity: No Food Insecurity (07/30/2024)  Housing: Low Risk  (07/30/2024)  Transportation Needs: No Transportation Needs (07/30/2024)  Utilities: Not At Risk (07/30/2024)  Financial Resource Strain: Low Risk  (01/26/2023)   Received from [facility]  Social Connections: Moderately Isolated (07/30/2024)  Tobacco Use: Medium Risk (07/29/2024)    Readmission Risk Interventions     No data to display

## 2024-08-04 DIAGNOSIS — U071 COVID-19: Secondary | ICD-10-CM | POA: Diagnosis not present

## 2024-08-04 LAB — CULTURE, BLOOD (ROUTINE X 2)
Culture: NO GROWTH
Special Requests: ADEQUATE

## 2024-08-04 NOTE — Plan of Care (Signed)

## 2024-08-04 NOTE — Progress Notes (Signed)
 Mobility Specialist Progress Note:    08/04/24 1606  Mobility  Activity Pivoted/transferred from bed to chair  Level of Assistance Moderate assist, patient does 50-74%  Assistive Device Front wheel walker  Distance Ambulated (ft) 2 ft  Range of Motion/Exercises Active;All extremities  Activity Response Tolerated well  Mobility visit 1 Mobility  Mobility Specialist Start Time (ACUTE ONLY) 1558  Mobility Specialist Stop Time (ACUTE ONLY) 1606  Mobility Specialist Time Calculation (min) (ACUTE ONLY) 8 min   Pt received requesting assistance to chair. Required ModA +2 with RW to stand and pivot. Tolerated well, required verbal cues throughout. Alarm on, nurse in room. All needs met.  Sherrilee Ditty Mobility Specialist Please contact via Special educational needs teacher or  Rehab office at [phone]

## 2024-08-04 NOTE — Progress Notes (Signed)
 [facility] Progress Note  Patient: Rodney Richmond    FMW:[id number]  DOA: 07/29/2024     Date of Service: the patient was seen and examined on 08/04/2024  Chief Complaint  Patient presents with   Fever   Brief hospital course: Rodney Richmond is a 78 y.o. male with medical history significant of essential hypertension, Parkinson's disease, PTSD, history of prior brain tumor who was brought in by family secondary to generalized weakness and multiple falls at home.  Patient had at least 3 episode of falling at home today where he slumped on the floor.  EMS was called all 3 times.  Ultimately after the third episode patient was brought to the ER with generalized weakness and fall.  There is associated fever and weakness.  Family believe patient had exposure to the flu.  Wife at bedside.  On arrival his temperature is 101.9 and heart rate 101-105.  He is 98% on room air and blood pressure was stable.  His respiratory rate however was 28.  Patient had workup for infectious sources and so far COVID-19 was positive.  Patient suspected to have acute illness with generalized weakness and fall as a result of COVID-19 infection.  He is not having any respiratory symptoms.  Not hypoxic.  Patient will be admitted for further supportive care due to failure to thrive at home and recurrent falling.    Assessment and Plan:  # COVID-19 infection: Patient has no respiratory symptoms but appears to be dehydrated, weak, sepsis ruled out.    Continue supportive care.  He was taking methotrexate so he is relatively immunocompromised.  He will have been a good candidate for Paxlovid.  No steroids and no remdesivir.  Patient has no respiratory symptoms and not hypoxic.     # hyponatremia: Possible due to nutritional deficiency and dehydration. S/p IVF, sodium improved.  # hyperlipidemia: Continue statin CK 158 wnl   # Parkinson's disease: Confirmed on resume home regimen.  Continue to monitor   # PTSD:  Continue home regimen.   # Iron  deficiency, Tsat 11% Venofer  300 mg IV one-time dose given, continue oral iron  supplement and follow with PCP.  # h/o LLE on Eliquis .  Patient is following vascular surgery as an outpatient who will let her mind when to discontinue DOAC  # s/p Multiple falls at home: Probably from infection as a result of COVID-19.  Patient is overall weak.   Debility and deconditioning, PT and OT evaluation done, recommended SNF placement Vitamin D  level within normal range  # Vitamin B12 level 376, goal >400, started vitamin B12 oral supplement.  Repeat B12 level after 3 to 6 months.  Follow with PCP.   Body mass index is 24.68 kg/m.  Interventions:  Diet: Heart healthy diet DVT Prophylaxis: Eliquis   Advance goals of care discussion: Full code  Family Communication: family was not present at bedside, at the time of interview.  The pt provided permission to discuss medical plan with the family. Opportunity was given to ask question and all questions were answered satisfactorily.   Disposition:  Pt is from Home, admitted with Fall and COVID, still has risk of fall, which precludes a safe discharge. Discharge to SNF, when bed will be available. TOC involved for discharge plan, pending SNF placement   Subjective: No significant events overnight, patient was sleeping comfortably, stated that he is sleepy today.  Still feels weak and tired, denied any other complaints. Patient is not able to get up by himself out  of the bed, needs help. Patient agreed for SNF placement.   Physical Exam: General: NAD, lying comfortably Appear in no distress, affect appropriate Eyes: PERRLA ENT: Oral Mucosa Clear, moist  Neck: no JVD,  Cardiovascular: S1 and S2 Present, no Murmur,  Respiratory: good respiratory effort, Bilateral Air entry equal and Decreased, no Crackles, no wheezes Abdomen: Bowel Sound present, Soft and no tenderness,  Skin: no rashes Extremities: no Pedal  edema, no calf tenderness Neurologic: without any new focal findings Gait not checked due to patient safety concerns  Vitals:   08/03/24 1843 08/03/24 2129 08/04/24 0322 08/04/24 0945  BP: 112/77 133/75 109/83 124/81  Pulse: 94 68 67 60  Resp: 16 19 16 16   Temp: 97.8 F (36.6 C) 97.9 F (36.6 C)  97.9 F (36.6 C)  TempSrc: Oral     SpO2: 98% 90% 94% 100%  Weight:      Height:        Intake/Output Summary (Last 24 hours) at 08/04/2024 1453 Last data filed at 08/04/2024 0900 Gross per 24 hour  Intake 220 ml  Output 350 ml  Net -130 ml   Filed Weights   07/29/24 1858  Weight: 82.6 kg    Data Reviewed: I have personally reviewed and interpreted daily labs, tele strips, imagings as discussed above. I reviewed all nursing notes, pharmacy notes, vitals, pertinent old records I have discussed plan of care as described above with RN and patient/family.  CBC: Recent Labs  Lab 07/29/24 1908 07/30/24 1504 07/31/24 0518 08/01/24 0449 08/02/24 0602  WBC 3.6* 3.0* 4.0 4.0 4.3  NEUTROABS 2.8  --   --   --   --   HGB 11.4* 10.8* 11.7* 11.4* 12.2*  HCT 34.1* 33.4* 35.1* 34.6* 36.5*  MCV 98.0 98.8 97.8 97.5 95.8  PLT 92* 81* 84* 100* 124*   Basic Metabolic Panel: Recent Labs  Lab 07/29/24 1908 07/30/24 0238 07/31/24 0518 08/01/24 0449 08/02/24 0602  NA 134* 139 142 142 144  K 4.0 4.4 3.9 4.3 4.4  CL 103 108 108 109 111  CO2 25 28 28 28 29   GLUCOSE 100* 88 80 83 85  BUN 19 16 15 15 18   CREATININE 0.71 0.68 0.87 0.62 0.67  CALCIUM 7.9* 8.2* 8.1* 8.1* 8.2*  MG  --  2.1 2.1 2.1 2.2  PHOS  --  2.5 2.8 3.1 2.9    Studies: No results found.   Scheduled Meds:  apixaban   2.5 mg Oral BID   vitamin C   500 mg Oral Daily   bisacodyl   10 mg Oral QHS   carbidopa -levodopa   3 tablet Oral BID   And   carbidopa -levodopa   2 tablet Oral QHS   carbidopa -levodopa   1 tablet Oral BID   clonazePAM   1.5 mg Oral QHS   vitamin B-12  1,000 mcg Oral Daily   docusate sodium   100 mg  Oral BID   donepezil   10 mg Oral QHS   finasteride   5 mg Oral Daily   fluticasone   1 spray Each Nare QHS   gabapentin   100 mg Oral Daily   iron  polysaccharides  150 mg Oral Daily   leptospermum manuka honey  1 Application Topical Daily   metoprolol  succinate  12.5 mg Oral Daily   pantoprazole   40 mg Oral BID   Pimavanserin  Tartrate  1 capsule Oral QHS   tamsulosin   0.4 mg Oral QHS   traZODone   100 mg Oral QHS   venlafaxine  XR  150 mg Oral Q  breakfast   Continuous Infusions:   PRN Meds: acetaminophen  **OR** acetaminophen , bisacodyl , ondansetron  **OR** ondansetron  (ZOFRAN ) IV, polyethylene glycol, sodium chloride   Time spent: 35 minutes  Author: ELVAN SOR. MD [facility] 08/04/2024 2:53 PM  To reach On-call, see care teams to locate the attending and reach out to them via [email or web address]. If 7PM-7AM, please contact night-coverage If you still have difficulty reaching the attending provider, please page the [facility] (Director on Call) for [facility] on amion for assistance.

## 2024-08-05 DIAGNOSIS — U071 COVID-19: Secondary | ICD-10-CM | POA: Diagnosis not present

## 2024-08-05 NOTE — Progress Notes (Signed)
 [facility] Progress Note  Patient: Rodney Richmond    FMW:[id number]  DOA: 07/29/2024     Date of Service: the patient was seen and examined on 08/05/2024  Chief Complaint  Patient presents with   Fever   Brief hospital course: Rodney Richmond is a 78 y.o. male with medical history significant of essential hypertension, Parkinson's disease, PTSD, history of prior brain tumor who was brought in by family secondary to generalized weakness and multiple falls at home.  Patient had at least 3 episode of falling at home today where he slumped on the floor.  EMS was called all 3 times.  Ultimately after the third episode patient was brought to the ER with generalized weakness and fall.  There is associated fever and weakness.  Family believe patient had exposure to the flu.  Wife at bedside.  On arrival his temperature is 101.9 and heart rate 101-105.  He is 98% on room air and blood pressure was stable.  His respiratory rate however was 28.  Patient had workup for infectious sources and so far COVID-19 was positive.  Patient suspected to have acute illness with generalized weakness and fall as a result of COVID-19 infection.  He is not having any respiratory symptoms.  Not hypoxic.  Patient will be admitted for further supportive care due to failure to thrive at home and recurrent falling.    Assessment and Plan:  # COVID-19 infection: Patient has no respiratory symptoms but appears to be dehydrated, weak, sepsis ruled out.    Continue supportive care.  He was taking methotrexate so he is relatively immunocompromised.  He will have been a good candidate for Paxlovid.  No steroids and no remdesivir.  Patient has no respiratory symptoms and not hypoxic.     # hyponatremia: Possible due to nutritional deficiency and dehydration. S/p IVF, sodium improved.  # hyperlipidemia: Continue statin CK 158 wnl   # Parkinson's disease: Confirmed on resume home regimen.  Continue to monitor   # PTSD:  Continue home regimen.   # Iron  deficiency, Tsat 11% Venofer  300 mg IV one-time dose given, continue oral iron  supplement and follow with PCP.  # h/o LLE on Eliquis .  Patient is following vascular surgery as an outpatient who will let her mind when to discontinue DOAC  # s/p Multiple falls at home: Probably from infection as a result of COVID-19.  Patient is overall weak.   Debility and deconditioning, PT and OT evaluation done, recommended SNF placement Vitamin D  level within normal range  # Vitamin B12 level 376, goal >400, started vitamin B12 oral supplement.  Repeat B12 level after 3 to 6 months.  Follow with PCP.   Body mass index is 24.68 kg/m.  Interventions:  Diet: Heart healthy diet DVT Prophylaxis: Eliquis   Advance goals of care discussion: Full code  Family Communication: family was not present at bedside, at the time of interview.  The pt provided permission to discuss medical plan with the family. Opportunity was given to ask question and all questions were answered satisfactorily.   Disposition:  Pt is from Home, admitted with Fall and COVID, still has risk of fall, which precludes a safe discharge. Discharge to SNF, when bed will be available. TOC involved for discharge plan, pending SNF placement   Subjective: No significant events overnight, patient was sleeping comfortably, stated that he is sleepy today.  Still feels weak and tired, denied any other complaints. Patient is not able to get up by himself out  of the bed, needs help. Patient agreed for SNF placement.   Physical Exam: General: NAD, lying comfortably Appear in no distress, affect appropriate Eyes: PERRLA ENT: Oral Mucosa Clear, moist  Neck: no JVD,  Cardiovascular: S1 and S2 Present, no Murmur,  Respiratory: good respiratory effort, Bilateral Air entry equal and Decreased, no Crackles, no wheezes Abdomen: Bowel Sound present, Soft and no tenderness,  Skin: no rashes Extremities: no Pedal  edema, no calf tenderness Neurologic: without any new focal findings Gait not checked due to patient safety concerns  Vitals:   08/04/24 0945 08/04/24 2027 08/05/24 0429 08/05/24 0924  BP: 124/81 131/75 129/71 (!) 143/79  Pulse: 60 64 66 70  Resp: 16 16 18 20   Temp: 97.9 F (36.6 C) 97.9 F (36.6 C) 97.8 F (36.6 C) 98.1 F (36.7 C)  TempSrc:    Oral  SpO2: 100% 100% 99% 100%  Weight:      Height:        Intake/Output Summary (Last 24 hours) at 08/05/2024 1432 Last data filed at 08/05/2024 1300 Gross per 24 hour  Intake 420 ml  Output 200 ml  Net 220 ml   Filed Weights   07/29/24 1858  Weight: 82.6 kg    Data Reviewed: I have personally reviewed and interpreted daily labs, tele strips, imagings as discussed above. I reviewed all nursing notes, pharmacy notes, vitals, pertinent old records I have discussed plan of care as described above with RN and patient/family.  CBC: Recent Labs  Lab 07/29/24 1908 07/30/24 1504 07/31/24 0518 08/01/24 0449 08/02/24 0602  WBC 3.6* 3.0* 4.0 4.0 4.3  NEUTROABS 2.8  --   --   --   --   HGB 11.4* 10.8* 11.7* 11.4* 12.2*  HCT 34.1* 33.4* 35.1* 34.6* 36.5*  MCV 98.0 98.8 97.8 97.5 95.8  PLT 92* 81* 84* 100* 124*   Basic Metabolic Panel: Recent Labs  Lab 07/29/24 1908 07/30/24 0238 07/31/24 0518 08/01/24 0449 08/02/24 0602  NA 134* 139 142 142 144  K 4.0 4.4 3.9 4.3 4.4  CL 103 108 108 109 111  CO2 25 28 28 28 29   GLUCOSE 100* 88 80 83 85  BUN 19 16 15 15 18   CREATININE 0.71 0.68 0.87 0.62 0.67  CALCIUM 7.9* 8.2* 8.1* 8.1* 8.2*  MG  --  2.1 2.1 2.1 2.2  PHOS  --  2.5 2.8 3.1 2.9    Studies: No results found.   Scheduled Meds:  apixaban   2.5 mg Oral BID   vitamin C   500 mg Oral Daily   bisacodyl   10 mg Oral QHS   carbidopa -levodopa   3 tablet Oral BID   And   carbidopa -levodopa   2 tablet Oral QHS   carbidopa -levodopa   1 tablet Oral BID   clonazePAM   1.5 mg Oral QHS   vitamin B-12  1,000 mcg Oral Daily    docusate sodium   100 mg Oral BID   donepezil   10 mg Oral QHS   finasteride   5 mg Oral Daily   fluticasone   1 spray Each Nare QHS   gabapentin   100 mg Oral Daily   iron  polysaccharides  150 mg Oral Daily   leptospermum manuka honey  1 Application Topical Daily   metoprolol  succinate  12.5 mg Oral Daily   pantoprazole   40 mg Oral BID   Pimavanserin  Tartrate  1 capsule Oral QHS   tamsulosin   0.4 mg Oral QHS   traZODone   100 mg Oral QHS   venlafaxine  XR  150 mg Oral Q breakfast   Continuous Infusions:   PRN Meds: acetaminophen  **OR** acetaminophen , bisacodyl , ondansetron  **OR** ondansetron  (ZOFRAN ) IV, polyethylene glycol, sodium chloride   Time spent: 35 minutes  Author: ELVAN SOR. MD [facility] 08/05/2024 2:32 PM  To reach On-call, see care teams to locate the attending and reach out to them via [email or web address]. If 7PM-7AM, please contact night-coverage If you still have difficulty reaching the attending provider, please page the [facility] (Director on Call) for [facility] on amion for assistance.

## 2024-08-05 NOTE — Progress Notes (Signed)
 Physical Therapy Treatment Patient Details Name: Rodney Richmond MRN: [id number] DOB: Feb 14, 1946 Today's Date: 08/05/2024   History of Present Illness 78 y/o male presented to ED on 07/29/24 for fever, weakness, and falls. Tested positive for COVID. PMH: Parkinson's, benign brain tumor, HTN, PTSD    PT Comments  Pt resting in bed upon PT arrival; pt agreeable to therapy; pt's wife present during session.  No c/o pain.  During session pt was CGA semi-supine to sitting EOB; mod assist to stand from bed (x2 trials); CGA to ambulate 12 feet with RW use; and min assist to lay back down in bed.  Pt progressing with functional mobility compared to previous therapy session.  Pt's wife attempted to assist pt to stand (like she typically does at home) but unable without therapist assist d/t pt requiring more assist than previously.  Will continue to focus on strengthening, activity tolerance, and progressive functional mobility during hospitalization.   If plan is discharge home, recommend the following: A lot of help with bathing/dressing/bathroom;Assistance with cooking/housework;Assist for transportation;Help with stairs or ramp for entrance;Direct supervision/assist for medications management;Direct supervision/assist for financial management;Assistance with feeding;A lot of help with walking and/or transfers   Can travel by private vehicle     No  Equipment Recommendations  Other (comment) (TBD at next facility)    Recommendations for Other Services       Precautions / Restrictions Precautions Precautions: Fall Recall of Precautions/Restrictions: Intact Restrictions Weight Bearing Restrictions Per Provider Order: No     Mobility  Bed Mobility Overal bed mobility: Needs Assistance Bed Mobility: Supine to Sit, Sit to Supine     Supine to sit: Contact guard, HOB elevated Sit to supine: Min assist (for B LE's)   General bed mobility comments: increased time and effort for pt to perform as  much as possible on own    Transfers Overall transfer level: Needs assistance Equipment used: Rolling walker (2 wheels) Transfers: Sit to/from Stand Sit to Stand: Mod assist           General transfer comment: mod assist to stand from bed x2 trials; vc's for UE placement and to scoot to edge of bed prior to attempting to stand    Ambulation/Gait Ambulation/Gait assistance: Contact guard assist Gait Distance (Feet): 12 Feet Assistive device: Rolling walker (2 wheels)   Gait velocity: decreased     General Gait Details: short shuffling gait (with shoes on); flexed posture in general (including mildly in knees)   Stairs             Wheelchair Mobility     Tilt Bed    Modified Rankin (Stroke Patients Only)       Balance Overall balance assessment: Needs assistance Sitting-balance support: No upper extremity supported, Feet supported Sitting balance-Leahy Scale: Fair Sitting balance - Comments: SBA for safety (mild posterior tendency at times but pt able to self correct without cueing)   Standing balance support: Bilateral upper extremity supported, During functional activity Standing balance-Leahy Scale: Fair Standing balance comment: steady standing with B UE support on RW                            Communication Communication Communication: Impaired Factors Affecting Communication: Reduced clarity of speech  Cognition Arousal: Alert Behavior During Therapy: Flat affect                           PT -  Cognition Comments: Difficult to understand at times due to reduced clarity and volume of speech Following commands: Impaired Following commands impaired: Follows one step commands with increased time    Cueing Cueing Techniques: Verbal cues  Exercises      General Comments General comments (skin integrity, edema, etc.): HR in 80's bpm and SpO2 sats >92% on room air during session.  Nursing cleared pt for participation in  physical therapy.  Pt agreeable to PT session.      Pertinent Vitals/Pain Pain Assessment Pain Assessment: No/denies pain    Home Living                          Prior Function            PT Goals (current goals can now be found in the care plan section) Acute Rehab PT Goals Patient Stated Goal: to improve strength and transfers in order to go home PT Goal Formulation: With patient/family Time For Goal Achievement: 08/13/24 Potential to Achieve Goals: Fair Progress towards PT goals: Progressing toward goals    Frequency    Min 2X/week      PT Plan      Co-evaluation              AM-PAC PT 6 Clicks Mobility   Outcome Measure  Help needed turning from your back to your side while in a flat bed without using bedrails?: A Little Help needed moving from lying on your back to sitting on the side of a flat bed without using bedrails?: A Little Help needed moving to and from a bed to a chair (including a wheelchair)?: A Little Help needed standing up from a chair using your arms (e.g., wheelchair or bedside chair)?: A Lot Help needed to walk in hospital room?: A Little Help needed climbing 3-5 steps with a railing? : Total 6 Click Score: 15    End of Session Equipment Utilized During Treatment: Gait belt Activity Tolerance: Patient limited by fatigue Patient left: in bed;with call bell/phone within reach;with bed alarm set;with family/visitor present Nurse Communication: Mobility status;Precautions PT Visit Diagnosis: Unsteadiness on feet (R26.81);Muscle weakness (generalized) (M62.81);Other abnormalities of gait and mobility (R26.89)     Time: 8649-8576 PT Time Calculation (min) (ACUTE ONLY): 33 min  Charges:    $Gait Training: 8-22 mins $Therapeutic Activity: 8-22 mins PT General Charges $$ ACUTE PT VISIT: 1 Visit                     Damien Caulk, PT 08/05/24, 3:56 PM

## 2024-08-05 NOTE — Plan of Care (Signed)
   Problem: Education: Goal: Knowledge of General Education information will improve Description: Including pain rating scale, medication(s)/side effects and non-pharmacologic comfort measures Outcome: Progressing   Problem: Health Behavior/Discharge Planning: Goal: Ability to manage health-related needs will improve Outcome: Progressing   Problem: Activity: Goal: Risk for activity intolerance will decrease Outcome: Progressing

## 2024-08-05 NOTE — TOC Progression Note (Signed)
 Transition of Care [facility]) - Progression Note    Patient Details  Name: Rodney Richmond MRN: [id number] Date of Birth: 25-Jun-1946  Transition [facility]) CM/SW Contact  Corean ONEIDA Haddock, RN Phone Number: 08/05/2024, 11:42 AM  Clinical Narrative:     Wife aware that plan is for [employer] pending [facility] auth Email sent to [place] to follow up on status of Auth                    Expected Discharge Plan and Services                                               Social Drivers of Health (SDOH) Interventions SDOH Screenings   Food Insecurity: No Food Insecurity (07/30/2024)  Housing: Low Risk  (07/30/2024)  Transportation Needs: No Transportation Needs (07/30/2024)  Utilities: Not At Risk (07/30/2024)  Financial Resource Strain: Low Risk  (01/26/2023)   Received from [facility]  Social Connections: Moderately Isolated (07/30/2024)  Tobacco Use: Medium Risk (07/29/2024)    Readmission Risk Interventions     No data to display

## 2024-08-06 DIAGNOSIS — U071 COVID-19: Secondary | ICD-10-CM | POA: Diagnosis not present

## 2024-08-06 LAB — CBC
HCT: 38 % — ABNORMAL LOW (ref 39.0–52.0)
Hemoglobin: 12.5 g/dL — ABNORMAL LOW (ref 13.0–17.0)
MCH: 32.1 pg (ref 26.0–34.0)
MCHC: 32.9 g/dL (ref 30.0–36.0)
MCV: 97.4 fL (ref 80.0–100.0)
Platelets: 211 K/uL (ref 150–400)
RBC: 3.9 MIL/uL — ABNORMAL LOW (ref 4.22–5.81)
RDW: 14.1 % (ref 11.5–15.5)
WBC: 5.4 K/uL (ref 4.0–10.5)
nRBC: 0 % (ref 0.0–0.2)

## 2024-08-06 MED ORDER — ENSURE PLUS HIGH PROTEIN PO LIQD
237.0000 mL | Freq: Three times a day (TID) | ORAL | Status: DC
  Administered 2024-08-06 – 2024-08-09 (×9): 237 mL via ORAL

## 2024-08-06 MED ORDER — MEGESTROL ACETATE 400 MG/10ML PO SUSP
400.0000 mg | Freq: Every day | ORAL | Status: DC
Start: 2024-08-06 — End: 2024-08-10
  Administered 2024-08-06 – 2024-08-09 (×4): 400 mg via ORAL
  Filled 2024-08-06 (×4): qty 10

## 2024-08-06 MED ORDER — METOPROLOL SUCCINATE ER 25 MG PO TB24
12.5000 mg | ORAL_TABLET | Freq: Every day | ORAL | Status: DC
  Administered 2024-08-07 – 2024-08-09 (×3): 12.5 mg via ORAL
  Filled 2024-08-06 (×3): qty 1

## 2024-08-06 MED ORDER — HYDROCORTISONE 1 % EX CREA
TOPICAL_CREAM | Freq: Two times a day (BID) | CUTANEOUS | Status: AC
  Filled 2024-08-06: qty 28
  Filled 2024-08-06: qty 28.35

## 2024-08-06 NOTE — Progress Notes (Signed)
 Occupational Therapy Treatment Patient Details Name: Rodney Richmond MRN: [id number] DOB: [birth year] Today's Date: 08/06/2024   History of present illness 78 y/o male presented to ED on 07/29/24 for fever, weakness, and falls. Tested positive for COVID. PMH: Parkinson's, benign brain tumor, HTN, PTSD   OT comments  Pt seen for OT treatment this date. Pt received in recliner with spouse present. Session focused on functional transfers + mobility. Pt fluctuates with level of assist required (anticipate PD meds and fatigue influence this) and today requires +2 for safe transfers. Attempted to perform STS from recliner level with MAX A +1 using RW, pt greatly limited by posterior lean, difficulties with extension in hips/trunk/knees, slow processing and impaired motor planning. Once upright, pt requires MAX A +1 for static standing balance, BUE tremors limit functional grip on RW for additional support and unable to safely attempt steps. MAX A to safely control descent to return to seated, pt with increasing rigidity and assist required to block L foot and RW to avoid BLE sliding. +2 MOD A to perform STS and pivot back to bed, MIN A to return to supine. Continued to educate and reiterate with spouse that he is unsafe to discharge home with +1 assist at this time. Discharge recommendation appropriate, OT will continue to follow.        If plan is discharge home, recommend the following:  Two people to help with walking and/or transfers;Two people to help with bathing/dressing/bathroom;Assistance with cooking/housework;Assist for transportation;Help with stairs or ramp for entrance   Equipment Recommendations  Other (comment)       Precautions / Restrictions Precautions Precautions: Fall Recall of Precautions/Restrictions: Intact Restrictions Weight Bearing Restrictions Per Provider Order: No       Mobility Bed Mobility Overal bed mobility: Needs Assistance Bed Mobility: Sit to Supine        Sit to supine: Min assist, HOB elevated, Used rails   General bed mobility comments: increased time for processing    Transfers Overall transfer level: Needs assistance Equipment used: Rolling walker (2 wheels) Transfers: Sit to/from Stand Sit to Stand: Max assist, Mod assist, +2 physical assistance, +2 safety/equipment     Step pivot transfers: Mod assist, +2 physical assistance     General transfer comment: attempted to perform STS with +1 assist (pt's baseline), pt requires increased time for processing and motor planning, pt ultimately able to rise with MAX A +1 using RW, heavy posterior lean, difficulties with anterior weight shift and extending through both knees & trunk. Once fully upright, pt requires mod-max +1 to maintain static balance (BUE tremors causing difficulties with functional grip on RW), pt unable to shift weight to take steps. OT cues patient to sit back, pt unable to process or motor plan to flex at hips/knees or trunk, maxA to control descent with L foot blocked to safely sit in recliner. called for +2, pt with improved ability to stand with MOD A +2, less posterior lean and takes pivotal steps towards bed with RW, requires assist to manage AD due to BUE tremors, pt able to return to seated with max cues for motor planning and control.     Balance Overall balance assessment: Needs assistance Sitting-balance support: No upper extremity supported, Feet supported Sitting balance-Leahy Scale: Fair Sitting balance - Comments: posterior lean, difficulties with anterior weight shift Postural control: Posterior lean Standing balance support: Bilateral upper extremity supported, During functional activity Standing balance-Leahy Scale: Poor Standing balance comment: heavy posterior lean, improves with multiple STS attempts,  difficulties with BUE tremors for functional grip on RW                           ADL either performed or assessed with clinical judgement    ADL Overall ADL's : Needs assistance/impaired                 Upper Body Dressing : Minimal assistance;Sitting Upper Body Dressing Details (indicate cue type and reason): recliner level, cues to perform anterior weight shift, pt is able to raise BUE for gown to be doffed / donned Lower Body Dressing: Maximal assistance;Sitting/lateral leans Lower Body Dressing Details (indicate cue type and reason): spouse dons shoes with MAX A Toilet Transfer: +2 for physical assistance;Cueing for safety;Cueing for sequencing;Moderate assistance;Maximal assistance;Rolling walker (2 wheels) Toilet Transfer Details (indicate cue type and reason): simulated from recliner         Functional mobility during ADLs: Maximal assistance;+2 for physical assistance;+2 for safety/equipment;Cueing for safety;Cueing for sequencing;Rolling walker (2 wheels)      Extremity/Trunk Assessment Upper Extremity Assessment Upper Extremity Assessment:  (pt with BUE tremors impacting ability to use RW (R worse than L))                     Communication Communication Communication: Impaired Factors Affecting Communication: Reduced clarity of speech   Cognition Arousal: Alert Behavior During Therapy: Flat affect               OT - Cognition Comments: flat affect, engages minimally, wife reports pt has not recieved PD meds for afternoon yet, slow processing and motor initiation                 Following commands: Impaired Following commands impaired: Follows one step commands with increased time      Cueing   Cueing Techniques: Verbal cues  Exercises Exercises: Other exercises Other Exercises Other Exercises: Discussed discharge recommendations with pt and spouse. Educated that pt continues to be a high falls risk, given pt's flucutating assist levels (+2 again this afternoon), pt is not safe to return home with +1 assist.            Pertinent Vitals/ Pain       Pain Assessment Pain  Assessment: No/denies pain   Frequency  Min 1X/week        Progress Toward Goals  OT Goals(current goals can now be found in the care plan section)  Progress towards OT goals: Progressing toward goals  Acute Rehab OT Goals OT Goal Formulation: With patient Time For Goal Achievement: 08/13/24 Potential to Achieve Goals: Fair ADL Goals Pt Will Perform Grooming: with supervision;standing Pt Will Perform Lower Body Dressing: with min assist;sit to/from stand Pt Will Transfer to Toilet: with min assist;stand pivot transfer Pt Will Perform Toileting - Clothing Manipulation and hygiene: with min assist;sit to/from stand  Plan         AM-PAC OT 6 Clicks Daily Activity     Outcome Measure   Help from another person eating meals?: A Little Help from another person taking care of personal grooming?: A Little Help from another person toileting, which includes using toliet, bedpan, or urinal?: A Lot Help from another person bathing (including washing, rinsing, drying)?: A Lot Help from another person to put on and taking off regular upper body clothing?: A Lot Help from another person to put on and taking off regular lower body clothing?: A Lot 6 Click Score: 14  End of Session Equipment Utilized During Treatment: Gait belt;Rolling walker (2 wheels)  OT Visit Diagnosis: Unsteadiness on feet (R26.81);Repeated falls (R29.6);Muscle weakness (generalized) (M62.81)   Activity Tolerance Patient tolerated treatment well;No increased pain   Patient Left in bed;with call bell/phone within reach;with bed alarm set;with family/visitor present   Nurse Communication Mobility status        Time: 8573-8548 OT Time Calculation (min): 25 min  Charges: OT General Charges $OT Visit: 1 Visit OT Treatments $Self Care/Home Management : 8-22 mins $Therapeutic Activity: 8-22 mins  Jesus Poplin L. Tiziana Cislo, OTR/L  08/06/24, 3:09 PM

## 2024-08-06 NOTE — Progress Notes (Signed)
 Mobility Specialist - Progress Note   08/06/24 1034  Mobility  Activity Ambulated with assistance  Level of Assistance Minimal assist, patient does 75% or more  Assistive Device  (MinA +2 (safety))  Distance Ambulated (ft) 4 ft  Activity Response Tolerated well  Mobility visit 1 Mobility  Mobility Specialist Start Time (ACUTE ONLY) 1017  Mobility Specialist Stop Time (ACUTE ONLY) 1030  Mobility Specialist Time Calculation (min) (ACUTE ONLY) 13 min   Pt supine upon entry, utilizing RA. Pt expressed feeling sleepy, however agreeable to OOB amb this date. Pt completed bed mob ModA and STS to RW MinA +2. Pt amb ~4 ft within the room, however limited by fatigue--- shuffle gait. Recliner placed behind Pt. Pt left seated/supine with alarm set and needs within reach. RN and NT notified.  Rodney Richmond Mobility Specialist 08/06/24 10:52 AM

## 2024-08-06 NOTE — Progress Notes (Signed)
 [facility] Progress Note  Patient: Rodney Richmond    FMW:[id number]  DOA: 07/29/2024     Date of Service: the patient was seen and examined on 08/06/2024  Chief Complaint  Patient presents with   Fever   Brief hospital course: Rodney Richmond is a 77 y.o. male with medical history significant of essential hypertension, Parkinson's disease, PTSD, history of prior brain tumor who was brought in by family secondary to generalized weakness and multiple falls at home.  Patient had at least 3 episode of falling at home today where he slumped on the floor.  EMS was called all 3 times.  Ultimately after the third episode patient was brought to the ER with generalized weakness and fall.  There is associated fever and weakness.  Family believe patient had exposure to the flu.  Wife at bedside.  On arrival his temperature is 101.9 and heart rate 101-105.  He is 98% on room air and blood pressure was stable.  His respiratory rate however was 28.  Patient had workup for infectious sources and so far COVID-19 was positive.  Patient suspected to have acute illness with generalized weakness and fall as a result of COVID-19 infection.  He is not having any respiratory symptoms.  Not hypoxic.  Patient will be admitted for further supportive care due to failure to thrive at home and recurrent falling.    Assessment and Plan:  # COVID-19 infection: Patient has no respiratory symptoms but appears to be dehydrated, weak, sepsis ruled out.    Continue supportive care.  He was taking methotrexate so he is relatively immunocompromised.  He will have been a good candidate for Paxlovid.  No steroids and no remdesivir.  Patient has no respiratory symptoms and not hypoxic.     # hyponatremia: Possible due to nutritional deficiency and dehydration. S/p IVF, sodium improved.  # hyperlipidemia: Continue statin CK 158 wnl   # Parkinson's disease: Confirmed on resume home regimen.  Continue to monitor   # PTSD:  Continue home regimen.   # Iron  deficiency, Tsat 11% Venofer  300 mg IV one-time dose given, continue oral iron  supplement and follow with PCP.  # h/o LLE on Eliquis .  Patient is following vascular surgery as an outpatient who will let her mind when to discontinue DOAC  # s/p Multiple falls at home: Probably from infection as a result of COVID-19.  Patient is overall weak.   Debility and deconditioning, PT and OT evaluation done, recommended SNF placement Vitamin D  level within normal range  # Vitamin B12 level 376, goal >400, started vitamin B12 oral supplement.  Repeat B12 level after 3 to 6 months.  Follow with PCP.  # Anorexia, decreased appetite Started Ensure protein supplement and Megace  400 mg p.o. daily  Body mass index is 24.68 kg/m.  Interventions:  Diet: Heart healthy diet DVT Prophylaxis: Eliquis   Advance goals of care discussion: Full code  Family Communication: family was not present at bedside, at the time of interview.  The pt provided permission to discuss medical plan with the family. Opportunity was given to ask question and all questions were answered satisfactorily.   Disposition:  Pt is from Home, admitted with Fall and COVID, still has risk of fall, which precludes a safe discharge. Discharge to SNF, when bed will be available. TOC involved for discharge plan, pending SNF placement 9/16 insurance Auth is pending as per TOC  Subjective: No significant events overnight, patient did work with physical therapy, patient was Surveyor, minerals  comfortably in the recliner, still has generalized weakness, patient was sleepy not very active and alert.  Denied any specific complaints.  Low appetite.   Physical Exam: General: NAD, lying comfortably Appear in no distress, affect appropriate Eyes: PERRLA ENT: Oral Mucosa Clear, moist  Neck: no JVD,  Cardiovascular: S1 and S2 Present, no Murmur,  Respiratory: good respiratory effort, Bilateral Air entry equal and Decreased,  no Crackles, no wheezes Abdomen: Bowel Sound present, Soft and no tenderness,  Skin: no rashes Extremities: no Pedal edema, no calf tenderness Neurologic: without any new focal findings Gait not checked due to patient safety concerns  Vitals:   08/05/24 1503 08/05/24 2052 08/06/24 0426 08/06/24 1016  BP: 103/79 116/69 107/70 118/71  Pulse: 87 86 76 86  Resp: 18 16 16 14   Temp: 97.7 F (36.5 C) 98.8 F (37.1 C) 98.2 F (36.8 C) 98.3 F (36.8 C)  TempSrc: Oral   Oral  SpO2: 98% 98% 98% 100%  Weight:      Height:        Intake/Output Summary (Last 24 hours) at 08/06/2024 1453 Last data filed at 08/06/2024 1100 Gross per 24 hour  Intake 480 ml  Output 400 ml  Net 80 ml   Filed Weights   07/29/24 1858  Weight: 82.6 kg    Data Reviewed: I have personally reviewed and interpreted daily labs, tele strips, imagings as discussed above. I reviewed all nursing notes, pharmacy notes, vitals, pertinent old records I have discussed plan of care as described above with RN and patient/family.  CBC: Recent Labs  Lab 07/30/24 1504 07/31/24 0518 08/01/24 0449 08/02/24 0602 08/06/24 0559  WBC 3.0* 4.0 4.0 4.3 5.4  HGB 10.8* 11.7* 11.4* 12.2* 12.5*  HCT 33.4* 35.1* 34.6* 36.5* 38.0*  MCV 98.8 97.8 97.5 95.8 97.4  PLT 81* 84* 100* 124* 211   Basic Metabolic Panel: Recent Labs  Lab 07/31/24 0518 08/01/24 0449 08/02/24 0602  NA 142 142 144  K 3.9 4.3 4.4  CL 108 109 111  CO2 28 28 29   GLUCOSE 80 83 85  BUN 15 15 18   CREATININE 0.87 0.62 0.67  CALCIUM 8.1* 8.1* 8.2*  MG 2.1 2.1 2.2  PHOS 2.8 3.1 2.9    Studies: No results found.   Scheduled Meds:  apixaban   2.5 mg Oral BID   vitamin C   500 mg Oral Daily   bisacodyl   10 mg Oral QHS   carbidopa -levodopa   3 tablet Oral BID   And   carbidopa -levodopa   2 tablet Oral QHS   carbidopa -levodopa   1 tablet Oral BID   clonazePAM   1.5 mg Oral QHS   vitamin B-12  1,000 mcg Oral Daily   docusate sodium   100 mg Oral BID    donepezil   10 mg Oral QHS   feeding supplement  237 mL Oral TID BM   finasteride   5 mg Oral Daily   fluticasone   1 spray Each Nare QHS   gabapentin   100 mg Oral Daily   iron  polysaccharides  150 mg Oral Daily   leptospermum manuka honey  1 Application Topical Daily   megestrol   400 mg Oral Daily   [START ON 08/07/2024] metoprolol  succinate  12.5 mg Oral Daily   pantoprazole   40 mg Oral BID   Pimavanserin  Tartrate  1 capsule Oral QHS   tamsulosin   0.4 mg Oral QHS   traZODone   100 mg Oral QHS   venlafaxine  XR  150 mg Oral Q breakfast   Continuous Infusions:  PRN Meds: acetaminophen  **OR** acetaminophen , bisacodyl , ondansetron  **OR** ondansetron  (ZOFRAN ) IV, polyethylene glycol, sodium chloride   Time spent: 35 minutes  Author: ELVAN SOR. MD [facility] 08/06/2024 2:53 PM  To reach On-call, see care teams to locate the attending and reach out to them via [email or web address]. If 7PM-7AM, please contact night-coverage If you still have difficulty reaching the attending provider, please page the [facility] (Director on Call) for [facility] on amion for assistance.

## 2024-08-06 NOTE — TOC Progression Note (Signed)
 Transition of Care [facility]) - Progression Note    Patient Details  Name: Rodney Richmond MRN: [id number] Date of Birth: 01/19/1946  [facility]) CM/SW Contact  Lauraine JAYSON Carpen, LCSW Phone Number: 08/06/2024, 12:48 PM  Clinical Narrative: CSW called [facility] and confirmed patient is 100% service connected and eligible for [place] SN[facility]. Sent requested clinicals and forms to [facility] referral liaison.  Expected Discharge Plan and Services                                               Social Drivers of Health (SDOH) Interventions SDOH Screenings   Food Insecurity: No Food Insecurity (07/30/2024)  Housing: Low Risk  (07/30/2024)  Transportation Needs: No Transportation Needs (07/30/2024)  Utilities: Not At Risk (07/30/2024)  Financial Resource Strain: Low Risk  (01/26/2023)   Received from [facility]  Social Connections: Moderately Isolated (07/30/2024)  Tobacco Use: Medium Risk (07/29/2024)    Readmission Risk Interventions     No data to display

## 2024-08-07 ENCOUNTER — Inpatient Hospital Stay

## 2024-08-07 DIAGNOSIS — U071 COVID-19: Secondary | ICD-10-CM | POA: Diagnosis not present

## 2024-08-07 NOTE — Progress Notes (Signed)
 [facility] Progress Note  Patient: Rodney Richmond    FMW:[id number]  DOA: 07/29/2024     Date of Service: the patient was seen and examined on 08/07/2024  Chief Complaint  Patient presents with   Fever   Brief hospital course: Rodney Richmond is a 78 y.o. male with medical history significant of essential hypertension, Parkinson's disease, PTSD, history of prior brain tumor who was brought in by family secondary to generalized weakness and multiple falls at home.  Patient had at least 3 episode of falling at home today where he slumped on the floor.  EMS was called all 3 times.  Ultimately after the third episode patient was brought to the ER with generalized weakness and fall.  There is associated fever and weakness.  Family believe patient had exposure to the flu.  Wife at bedside.  On arrival his temperature is 101.9 and heart rate 101-105.  He is 98% on room air and blood pressure was stable.  His respiratory rate however was 28.  Patient had workup for infectious sources and so far COVID-19 was positive.  Patient suspected to have acute illness with generalized weakness and fall as a result of COVID-19 infection.  He is not having any respiratory symptoms.  Not hypoxic.  Patient will be admitted for further supportive care due to failure to thrive at home and recurrent falling.    Assessment and Plan:  # COVID-19 infection: Patient has no respiratory symptoms but appears to be dehydrated, weak, sepsis ruled out.    Continue supportive care.  He was taking methotrexate so he is relatively immunocompromised.  He will have been a good candidate for Paxlovid.  No steroids and no remdesivir.  Patient has no respiratory symptoms and not hypoxic.     # hyponatremia: Possible due to nutritional deficiency and dehydration. S/p IVF, sodium improved.  # hyperlipidemia: Continue statin CK 158 wnl   # Parkinson's disease: Confirmed on resume home regimen.  Continue to monitor   # PTSD:  Continue home regimen.   # Iron  deficiency, Tsat 11% Venofer  300 mg IV one-time dose given, continue oral iron  supplement and follow with PCP.  # h/o LLE on Eliquis .  Patient is following vascular surgery as an outpatient who will let her mind when to discontinue DOAC  # s/p Multiple falls at home: Probably from infection as a result of COVID-19.  Patient is overall weak.   Debility and deconditioning, PT and OT evaluation done, recommended SNF placement Vitamin D  level within normal range  # Vitamin B12 level 376, goal >400, started vitamin B12 oral supplement.  Repeat B12 level after 3 to 6 months.  Follow with PCP.  # Anorexia, decreased appetite Started Ensure protein supplement and Megace  400 mg p.o. daily  Body mass index is 24.68 kg/m.  Interventions:  Diet: Heart healthy diet DVT Prophylaxis: Eliquis   Advance goals of care discussion: Full code  Family Communication: family was not present at bedside, at the time of interview.  The pt provided permission to discuss medical plan with the family. Opportunity was given to ask question and all questions were answered satisfactorily.   Disposition:  Pt is from Home, admitted with Fall and COVID, still has risk of fall, which precludes a safe discharge. Discharge to SNF, when bed will be available. TOC involved for discharge plan, pending SNF placement 9/17 [facility] insurance Auth is pending as per TOC  Subjective: No significant events overnight,  patient did work with physical therapy, patient  was laying comfortably in the recliner, still has generalized weakness. No active issues   Physical Exam: General: NAD, lying comfortably Appear in no distress, affect appropriate Eyes: PERRLA ENT: Oral Mucosa Clear, moist  Neck: no JVD,  Cardiovascular: S1 and S2 Present, no Murmur,  Respiratory: good respiratory effort, Bilateral Air entry equal and Decreased, no Crackles, no wheezes Abdomen: Bowel Sound present, Soft and no  tenderness,  Skin: no rashes Extremities: no Pedal edema, no calf tenderness Neurologic: without any new focal findings Gait not checked due to patient safety concerns  Vitals:   08/06/24 2209 08/07/24 0536 08/07/24 0841 08/07/24 1507  BP: 119/81 113/75 114/73 124/87  Pulse: 94 (!) 102 91 95  Resp: 20 20 20 18   Temp: 98.1 F (36.7 C) 98 F (36.7 C) 98.2 F (36.8 C) 98 F (36.7 C)  TempSrc:   Oral Oral  SpO2: 97% 97% 99% 98%  Weight:      Height:        Intake/Output Summary (Last 24 hours) at 08/07/2024 1551 Last data filed at 08/07/2024 1253 Gross per 24 hour  Intake 360 ml  Output --  Net 360 ml   Filed Weights   07/29/24 1858  Weight: 82.6 kg    Data Reviewed: I have personally reviewed and interpreted daily labs, tele strips, imagings as discussed above. I reviewed all nursing notes, pharmacy notes, vitals, pertinent old records I have discussed plan of care as described above with RN and patient/family.  CBC: Recent Labs  Lab 08/01/24 0449 08/02/24 0602 08/06/24 0559  WBC 4.0 4.3 5.4  HGB 11.4* 12.2* 12.5*  HCT 34.6* 36.5* 38.0*  MCV 97.5 95.8 97.4  PLT 100* 124* 211   Basic Metabolic Panel: Recent Labs  Lab 08/01/24 0449 08/02/24 0602  NA 142 144  K 4.3 4.4  CL 109 111  CO2 28 29  GLUCOSE 83 85  BUN 15 18  CREATININE 0.62 0.67  CALCIUM 8.1* 8.2*  MG 2.1 2.2  PHOS 3.1 2.9    Studies: No results found.   Scheduled Meds:  apixaban   2.5 mg Oral BID   vitamin C   500 mg Oral Daily   bisacodyl   10 mg Oral QHS   carbidopa -levodopa   3 tablet Oral BID   And   carbidopa -levodopa   2 tablet Oral QHS   carbidopa -levodopa   1 tablet Oral BID   clonazePAM   1.5 mg Oral QHS   vitamin B-12  1,000 mcg Oral Daily   docusate sodium   100 mg Oral BID   donepezil   10 mg Oral QHS   feeding supplement  237 mL Oral TID BM   finasteride   5 mg Oral Daily   fluticasone   1 spray Each Nare QHS   gabapentin   100 mg Oral Daily   hydrocortisone  cream   Topical  BID   iron  polysaccharides  150 mg Oral Daily   leptospermum manuka honey  1 Application Topical Daily   megestrol   400 mg Oral Daily   metoprolol  succinate  12.5 mg Oral Daily   pantoprazole   40 mg Oral BID   Pimavanserin  Tartrate  1 capsule Oral QHS   tamsulosin   0.4 mg Oral QHS   traZODone   100 mg Oral QHS   venlafaxine  XR  150 mg Oral Q breakfast   Continuous Infusions:   PRN Meds: acetaminophen  **OR** acetaminophen , bisacodyl , ondansetron  **OR** ondansetron  (ZOFRAN ) IV, polyethylene glycol, sodium chloride   Time spent: 35 minutes  Author: ELVAN SOR. MD [facility] 08/07/2024 3:51 PM  To reach On-call, see care teams to locate the attending and reach out to them via [email or web address]. If 7PM-7AM, please contact night-coverage If you still have difficulty reaching the attending provider, please page the [facility] (Director on Call) for [facility] on amion for assistance.

## 2024-08-07 NOTE — Progress Notes (Signed)
 Physical Therapy Treatment Patient Details Name: Rodney Richmond MRN: [id number] DOB: [birth year] Today's Date: 08/07/2024   History of Present Illness 78 y/o male presented to ED on 07/29/24 for fever, weakness, and falls. Tested positive for COVID. PMH: Parkinson's, benign brain tumor, HTN, PTSD    PT Comments  Pt was sleeping on arrival, wife present and helpful in waking him for participation.  Pt struggled with most aspects of PT session.  He needed assist to get to EOB/sitting, struggled to initiate upward movement to get to standing and a lot of difficulty advancing either LE to do anything more than the most minimal shuffling.  He reports that he did a little walking to the bathroom earlier today, but was too tired and unable to replicate that activity at time of PT exam.  Pt will need ongoing PT, continue with POC.    If plan is discharge home, recommend the following: A lot of help with bathing/dressing/bathroom;Assistance with cooking/housework;Assist for transportation;Help with stairs or ramp for entrance;Direct supervision/assist for medications management;Direct supervision/assist for financial management;Assistance with feeding;A lot of help with walking and/or transfers   Can travel by private vehicle     No  Equipment Recommendations  Other (comment) (TBD)    Recommendations for Other Services       Precautions / Restrictions Precautions Precautions: Fall Recall of Precautions/Restrictions: Intact Restrictions Weight Bearing Restrictions Per Provider Order: No     Mobility  Bed Mobility Overal bed mobility: Needs Assistance Bed Mobility: Sit to Supine     Supine to sit: Min assist     General bed mobility comments: Pt made effort toward EOB and initiated upward movement but ultimately could not on his own and needed assist to attain sitting EOB    Transfers Overall transfer level: Needs assistance Equipment used: Rolling walker (2 wheels) Transfers: Sit  to/from Stand Sit to Stand: Mod assist, From elevated surface           General transfer comment: unable to rise on his own from standard height and raised 2-3.  Ultimately needing assist to initiate and complete the transition to standing    Ambulation/Gait Ambulation/Gait assistance: Mod assist Gait Distance (Feet): 4 Feet Assistive device: Rolling walker (2 wheels)         General Gait Details: Pt struggled with all weight shifting/stepping efforts.  highly reliant on walker and very much struggling to move either foot, but R seems especially stuck to the floor w/o heavy PT cuing and pt effort.  Pt managed a few very small and labored effort shuffling steps along EOB - but quick to fatigue and generally not very mobile.   Stairs             Wheelchair Mobility     Tilt Bed    Modified Rankin (Stroke Patients Only)       Balance Overall balance assessment: Needs assistance Sitting-balance support: No upper extremity supported, Feet supported Sitting balance-Leahy Scale: Fair     Standing balance support: Bilateral upper extremity supported, During functional activity Standing balance-Leahy Scale: Poor Standing balance comment: Pt did not have posterior lean as per prior standing efforts but was limited with heavy lean on walker, need for constant hands on guarding and up to modA to maintain upright posturing                            Communication Communication Communication: Impaired Factors Affecting Communication: Reduced clarity of speech  Cognition                               PT - Cognition Comments: Does not seem to indicate pain, unable to report pain Following commands: Impaired Following commands impaired: Follows one step commands with increased time    Cueing Cueing Techniques: Verbal cues, Tactile cues  Exercises      General Comments General comments (skin integrity, edema, etc.): Pt very functionally  limited, very effortful side shuffling to turn 90* but needed direct assist to move walker and excessive cuing for each step effort.      Pertinent Vitals/Pain      Home Living                          Prior Function            PT Goals (current goals can now be found in the care plan section) Acute Rehab PT Goals Patient Stated Goal: to improve strength and transfers in order to go home Progress towards PT goals:  (slow progress)    Frequency    Min 2X/week      PT Plan      Co-evaluation              AM-PAC PT 6 Clicks Mobility   Outcome Measure  Help needed turning from your back to your side while in a flat bed without using bedrails?: A Little Help needed moving from lying on your back to sitting on the side of a flat bed without using bedrails?: A Little Help needed moving to and from a bed to a chair (including a wheelchair)?: A Little Help needed standing up from a chair using your arms (e.g., wheelchair or bedside chair)?: A Lot Help needed to walk in hospital room?: A Little Help needed climbing 3-5 steps with a railing? : Total 6 Click Score: 15    End of Session Equipment Utilized During Treatment: Gait belt Activity Tolerance: Patient limited by fatigue Patient left: in bed;with call bell/phone within reach;with bed alarm set;with family/visitor present Nurse Communication: Mobility status;Precautions PT Visit Diagnosis: Unsteadiness on feet (R26.81);Muscle weakness (generalized) (M62.81);Other abnormalities of gait and mobility (R26.89)     Time: 8956-8884 PT Time Calculation (min) (ACUTE ONLY): 32 min  Charges:    $Therapeutic Activity: 23-37 mins PT General Charges $$ ACUTE PT VISIT: 1 Visit                     Carmin JONELLE Deed, DPT 08/07/2024, 12:48 PM

## 2024-08-07 NOTE — Progress Notes (Signed)
 This author at computer station nearby pt's room, heard pt calling Help! I need help!. OT entered room to find pt on floor, pt alert & with c/o of rib pain. OT immediately called for help from staff to assist. Pt states he had been on the floor for ~1 hour, fell / slid out of recliner, small dots of blood noted on floor from groin area (RN further assessing once pt safely back in bed). Chair alarm not activated. PTA, RN and charge RN in room to assist with use of Hoyer lift to bed.   Caytlin Better L. Annaliese Saez, OTR/L  08/07/24, 4:57 PM

## 2024-08-07 NOTE — Progress Notes (Signed)
 Mobility Specialist - Progress Note   08/07/24 1022  Mobility  Activity Ambulated with assistance  Level of Assistance Minimal assist, patient does 75% or more (+2)  Assistive Device Front wheel walker  Distance Ambulated (ft) 12 ft  Activity Response Tolerated well  Mobility visit 1 Mobility  Mobility Specialist Start Time (ACUTE ONLY) 0930  Mobility Specialist Stop Time (ACUTE ONLY) 0950  Mobility Specialist Time Calculation (min) (ACUTE ONLY) 20 min   Pt supine upon entry, utilizing RA. Pt completed bed mob MinA to bring BLE EOB, extra time required to bring trunk from sup to sit. Pt STS to RW Mod-MinA +2, amb to the bathroom MinA +2--- min VC's for RW navigation. Pt taking longer steps this date, no shuffle gait noted. Pt denied sitting in the recliner, returned to bed. Pt left semi fowler with alarm set and needs within reach.  America Silvan Mobility Specialist 08/07/24 10:30 AM

## 2024-08-08 DIAGNOSIS — F4310 Post-traumatic stress disorder, unspecified: Secondary | ICD-10-CM | POA: Diagnosis not present

## 2024-08-08 DIAGNOSIS — U071 COVID-19: Secondary | ICD-10-CM | POA: Diagnosis not present

## 2024-08-08 DIAGNOSIS — G20A1 Parkinson's disease without dyskinesia, without mention of fluctuations: Secondary | ICD-10-CM

## 2024-08-08 DIAGNOSIS — E785 Hyperlipidemia, unspecified: Secondary | ICD-10-CM | POA: Diagnosis not present

## 2024-08-08 DIAGNOSIS — E871 Hypo-osmolality and hyponatremia: Secondary | ICD-10-CM

## 2024-08-08 MED ORDER — ASCORBIC ACID 500 MG PO TABS: 500.0000 mg | ORAL_TABLET | Freq: Every day | ORAL | 1 refills | Status: AC

## 2024-08-08 MED ORDER — IRON (FERROUS SULFATE) 325 (65 FE) MG PO TABS: 1.0000 | ORAL_TABLET | Freq: Every day | ORAL | 1 refills | Status: AC

## 2024-08-08 MED ORDER — CYANOCOBALAMIN 1000 MCG PO TABS: 1000.0000 ug | ORAL_TABLET | Freq: Every day | ORAL | 1 refills | Status: AC

## 2024-08-08 MED ORDER — PANTOPRAZOLE SODIUM 40 MG PO TBEC: 40.0000 mg | DELAYED_RELEASE_TABLET | Freq: Every day | ORAL | 1 refills | Status: AC

## 2024-08-08 MED ORDER — BISACODYL 5 MG PO TBEC: 10.0000 mg | DELAYED_RELEASE_TABLET | Freq: Every day | ORAL | 0 refills | Status: AC

## 2024-08-08 MED ORDER — DOCUSATE SODIUM 100 MG PO CAPS: 100.0000 mg | ORAL_CAPSULE | Freq: Two times a day (BID) | ORAL | 0 refills | Status: AC

## 2024-08-08 MED ORDER — POLYSACCHARIDE IRON COMPLEX 150 MG PO CAPS: 150.0000 mg | ORAL_CAPSULE | Freq: Every day | ORAL | 1 refills | Status: DC

## 2024-08-08 NOTE — Progress Notes (Signed)
 Progress Note   Patient: Rodney Richmond FMW:[id number] DOB: February 12, 1946 DOA: 07/29/2024     10 DOS: the patient was seen and examined on 08/08/2024   Brief hospital course: MCKOY BHAKTA is a 78 y.o. male with medical history significant of essential hypertension, Parkinson's disease, PTSD, history of prior brain tumor who was brought in by family secondary to generalized weakness and multiple falls at home.  Patient had at least 3 episode of falling at home today where he slumped on the floor.  EMS was called all 3 times.  Ultimately after the third episode patient was brought to the ER with generalized weakness and fall.  There is associated fever and weakness.  Family believe patient had exposure to the flu.  Wife at bedside.  On arrival his temperature is 101.9 and heart rate 101-105.  He is 98% on room air and blood pressure was stable.  His respiratory rate however was 28.  Patient had workup for infectious sources and so far COVID-19 was positive.  Patient suspected to have acute illness with generalized weakness and fall as a result of COVID-19 infection.  He is not having any respiratory symptoms.  Not hypoxic.  Patient admitted for further supportive care due to failure to thrive at home and recurrent falling.   Assessment and Plan: COVID-19 infection: Patient has no respiratory symptoms but appears to be dehydrated, weak, sepsis ruled out.    Continue supportive care.  He was taking methotrexate so he is relatively immunocompromised.  He will have been a good candidate for Paxlovid.  No steroids and no remdesivir.  Patient has no respiratory symptoms and not hypoxic.   Hyponatremia: Possible due to nutritional deficiency and dehydration. Sodium improved with fluids.   Hyperlipidemia: Continue statin   Parkinson's disease: Confirmed on resume home regimen Sinemet , Denepezil.     PTSD: Continue home regimen.   Iron  deficiency, Tsat 11% Venofer  300 mg IV one-time dose given, continue  oral iron  supplement and follow with PCP.   H/o LLE on Eliquis .  Patient is following vascular surgery as an outpatient who will let her mind when to discontinue DOAC   S/p Multiple falls at home: Probably from infection as a result of COVID-19.  Patient is overall weak.   Debility and deconditioning, PT and OT evaluation done, recommended SNF placement Vitamin D  level within normal range   Vitamin B12 level 376, goal >400, started vitamin B12 oral supplement.  Repeat B12 level after 3 to 6 months.  Follow with PCP.   Anorexia, decreased appetite Now eating fair. Stopped Megace . Continue protein supplements. Assist with feeding.  Palliative services did see him, wishes full code.      Out of bed to chair. Incentive spirometry. Nursing supportive care. Fall, aspiration precautions. Diet:  Diet Orders (From admission, onward)     Start     Ordered   07/29/24 2356  Diet Heart Room service appropriate? Yes; Fluid consistency: Thin  Diet effective now       Question Answer Comment  Room service appropriate? Yes   Fluid consistency: Thin      07/29/24 2356           DVT prophylaxis: apixaban  (ELIQUIS ) tablet 2.5 mg Start: 07/30/24 1000 apixaban  (ELIQUIS ) tablet 2.5 mg  Level of care: Med-Surg   Code Status: Full Code  Subjective: Patient is seen and examined today morning. He is lying in bed, NT at bedside feeding him. Denies any complaints. DC held as facility want him  isolated for 11 days post COVID.  Physical Exam: Vitals:   08/08/24 0354 08/08/24 0800 08/08/24 0929 08/08/24 1400  BP: 115/67 109/68 126/78 112/72  Pulse: 80 68 93 84  Resp: 16 18 16 18   Temp: 98 F (36.7 C) 97.7 F (36.5 C)    TempSrc:  Oral  Axillary  SpO2: 96% 98% 96% 99%  Weight:      Height:        General - Elderly Caucasian weak male, no apparent distress HEENT - PERRLA, EOMI, atraumatic head, non tender sinuses. Lung - Clear, basal rales, rhonchi, wheezes. Heart - S1, S2 heard, no  murmurs, rubs, trace pedal edema. Abdomen - Soft, non tender, bowel sounds good Neuro - Alert, awake and oriented, non focal exam. Skin - Warm and dry.  Data Reviewed:      Latest Ref Rng & Units 08/06/2024    5:59 AM 08/02/2024    6:02 AM 08/01/2024    4:49 AM  CBC  WBC 4.0 - 10.5 K/uL 5.4  4.3  4.0   Hemoglobin 13.0 - 17.0 g/dL 87.4  87.7  88.5   Hematocrit 39.0 - 52.0 % 38.0  36.5  34.6   Platelets 150 - 400 K/uL 211  124  100       Latest Ref Rng & Units 08/02/2024    6:02 AM 08/01/2024    4:49 AM 07/31/2024    5:18 AM  BMP  Glucose 70 - 99 mg/dL 85  83  80   BUN 8 - 23 mg/dL 18  15  15    Creatinine 0.61 - 1.24 mg/dL 9.32  9.37  9.12   Sodium 135 - 145 mmol/L 144  142  142   Potassium 3.5 - 5.1 mmol/L 4.4  4.3  3.9   Chloride 98 - 111 mmol/L 111  109  108   CO2 22 - 32 mmol/L 29  28  28    Calcium 8.9 - 10.3 mg/dL 8.2  8.1  8.1    CT HEAD WO CONTRAST ( ) Result Date: 08/07/2024 CLINICAL DATA:  Head trauma, minor, normal mental status (Age 42-64y) EXAM: CT HEAD WITHOUT CONTRAST TECHNIQUE: Contiguous axial images were obtained from the base of the skull through the vertex without intravenous contrast. RADIATION DOSE REDUCTION: This exam was performed according to the departmental dose-optimization program which includes automated exposure control, adjustment of the mA and/or kV according to patient size and/or use of iterative reconstruction technique. COMPARISON:  CT head 01/14/2022 FINDINGS: Brain: Patchy and confluent areas of decreased attenuation are noted throughout the deep and periventricular white matter of the cerebral hemispheres bilaterally, compatible with chronic microvascular ischemic disease. No evidence of large-territorial acute infarction. No parenchymal hemorrhage. No mass lesion. No extra-axial collection. No mass effect or midline shift. No hydrocephalus. Basilar cisterns are patent. Vascular: No hyperdense vessel. Skull: No acute fracture or focal lesion.  Sinuses/Orbits: Sphenoid, ethmoid, frontal sinus wall hypertrophy suggestive of chronic sinusitis. Associated mucosal thickening. The mastoid air cells are clear. The orbits are unremarkable. Other: None. IMPRESSION: 1. No acute intracranial abnormality. 2. Chronic pansinusitis. Electronically Signed   By: Morgane  Naveau M.D.   On: 08/07/2024 17:08   CT CHEST WO CONTRAST Result Date: 08/07/2024 CLINICAL DATA:  Rib fracture suspected, traumatic r/o Rib fractures. COVID positive EXAM: CT CHEST WITHOUT CONTRAST TECHNIQUE: Multidetector CT imaging of the chest was performed following the standard protocol without IV contrast. RADIATION DOSE REDUCTION: This exam was performed according to the departmental dose-optimization program which includes  automated exposure control, adjustment of the mA and/or kV according to patient size and/or use of iterative reconstruction technique. COMPARISON:  None Available. FINDINGS: Cardiovascular: The thoracic aorta is normal in caliber. The heart is normal in size. Trace pericardial effusion. Three-vessel coronary artery calcification. Atherosclerotic plaque. Lungs/Pleura: Right upper lobe surgical changes suggestive of wedge-shaped for section. Bilateral lower lobe dependent atelectasis. No focal consolidation. Subpleural triangular 5 mm pulmonary nodule. No pulmonary mass. No pulmonary contusion or laceration. No pneumatocele formation. No pleural effusion. No pneumothorax. No hemothorax. Mediastinum/Nodes: No pneumomediastinum. The central airways are patent. The esophagus is unremarkable. The thyroid is unremarkable. Limited evaluation for hilar lymphadenopathy on this noncontrast study. No mediastinal or axillary lymphadenopathy. Musculoskeletal/Chest wall No chest wall mass. No acute rib or sternal fracture. No spinal fracture. Upper Abdomen: No acute abnormality. IMPRESSION: 1. No acute intrathoracic traumatic injury with limited evaluation on this noncontrast study. 2. No  acute fracture or traumatic malalignment of the thoracic spine. 3. 5 mm pulmonary nodule. No follow-up needed if patient is low-risk.This recommendation follows the consensus statement: Guidelines for Management of Incidental Pulmonary Nodules Detected on CT Images: From the Fleischner Society 2017; Radiology 2017; 284:228-243. 4. Aortic Atherosclerosis (ICD10-I70.0) including three-vessel coronary artery calcification. Electronically Signed   By: Morgane  Naveau M.D.   On: 08/07/2024 17:07    Family Communication: Discussed with patient, understand and agree. All questions answered.  Disposition: Status is: Inpatient Remains inpatient appropriate because: [facility] tomorrow.  Planned Discharge Destination: Skilled nursing facility     Time spent: 44 minutes  Author: Concepcion Riser, MD 08/08/2024 3:10 PM Secure chat 7am to 7pm For on call review [email or web address].

## 2024-08-08 NOTE — TOC Progression Note (Signed)
 Transition of Care [facility]) - Progression Note    Patient Details  Name: Rodney Richmond MRN: [id number] Date of Birth: 1946-06-27  [facility] CM/SW Contact  Corean ONEIDA Haddock, RN Phone Number: 08/08/2024, 11:25 AM  Clinical Narrative:     Patient with [facility] auth to DC to [facility] Per [place] with [facility] they do not have a private room today, so patient will have to admit tomorrow on day 11 of covid.  Wife updated                    Expected Discharge Plan and Services         Expected Discharge Date: 08/08/24                                     Social Drivers of Health (SDOH) Interventions SDOH Screenings   Food Insecurity: No Food Insecurity (07/30/2024)  Housing: Low Risk  (07/30/2024)  Transportation Needs: No Transportation Needs (07/30/2024)  Utilities: Not At Risk (07/30/2024)  Financial Resource Strain: Low Risk  (01/26/2023)   Received from [facility]  Social Connections: Moderately Isolated (07/30/2024)  Tobacco Use: Medium Risk (07/29/2024)    Readmission Risk Interventions     No data to display

## 2024-08-08 NOTE — Plan of Care (Signed)
  Problem: Acute Rehab OT Goals (only OT should resolve) Goal: Pt. Will Perform Grooming Outcome: Adequate for Discharge Goal: Pt. Will Perform Lower Body Dressing Outcome: Adequate for Discharge Goal: Pt. Will Transfer To Toilet Outcome: Adequate for Discharge Goal: Pt. Will Perform Toileting-Clothing Manipulation Outcome: Adequate for Discharge   Problem: Education: Goal: Knowledge of General Education information will improve Description: Including pain rating scale, medication(s)/side effects and non-pharmacologic comfort measures 08/08/2024 1010 by Les Delon CROME, RN Outcome: Adequate for Discharge 08/08/2024 1010 by Les Delon CROME, RN Outcome: Progressing   Problem: Health Behavior/Discharge Planning: Goal: Ability to manage health-related needs will improve Outcome: Adequate for Discharge   Problem: Clinical Measurements: Goal: Ability to maintain clinical measurements within normal limits will improve Outcome: Adequate for Discharge Goal: Will remain free from infection Outcome: Adequate for Discharge Goal: Diagnostic test results will improve Outcome: Adequate for Discharge Goal: Respiratory complications will improve Outcome: Adequate for Discharge Goal: Cardiovascular complication will be avoided Outcome: Adequate for Discharge   Problem: Activity: Goal: Risk for activity intolerance will decrease Outcome: Adequate for Discharge   Problem: Coping: Goal: Level of anxiety will decrease Outcome: Adequate for Discharge   Problem: Elimination: Goal: Will not experience complications related to bowel motility Outcome: Adequate for Discharge Goal: Will not experience complications related to urinary retention Outcome: Adequate for Discharge   Problem: Pain Managment: Goal: General experience of comfort will improve and/or be controlled Outcome: Adequate for Discharge   Problem: Safety: Goal: Ability to remain free from injury will  improve Outcome: Adequate for Discharge   Problem: Acute Rehab PT Goals(only PT should resolve) Goal: Pt Will Go Supine/Side To Sit Outcome: Adequate for Discharge Goal: Patient Will Transfer Sit To/From Stand Outcome: Adequate for Discharge Goal: Pt Will Transfer Bed To Chair/Chair To Bed Outcome: Adequate for Discharge Goal: Pt Will Ambulate Outcome: Adequate for Discharge

## 2024-08-08 NOTE — Plan of Care (Signed)
   Problem: Education: Goal: Knowledge of General Education information will improve Description Including pain rating scale, medication(s)/side effects and non-pharmacologic comfort measures Outcome: Progressing

## 2024-08-08 NOTE — Progress Notes (Signed)
 Mobility Specialist - Progress Note   08/08/24 1054  Mobility  Activity Ambulated with assistance  Level of Assistance Moderate assist, patient does 50-74%  Assistive Device Front wheel walker  Distance Ambulated (ft) 24 ft  Activity Response Tolerated well  Mobility visit 1 Mobility  Mobility Specialist Start Time (ACUTE ONLY) 1021  Mobility Specialist Stop Time (ACUTE ONLY) 1047  Mobility Specialist Time Calculation (min) (ACUTE ONLY) 26 min   Pt supine upon entry, utilizing RA. Pt completed bed mob, STS to RW and amb to the bathroom ModA of one--- shuffle gait. Unsuccessful BM. Pt STS to RW MinA +2, returned to bed ModA +2--- Pt required multimodal cueing for sequencing during return to room. Pt noticeably fatigued once seated EOB. Pt returned supine, left with alarm set and needs within reach.  America Silvan Mobility Specialist 08/08/24 12:28 PM

## 2024-08-08 NOTE — Discharge Summary (Signed)
 Physician Discharge Summary   Patient: Rodney Richmond MRN: [id number] DOB: 1946/02/07  Admit date:     07/29/2024  Discharge date: 08/09/24  Discharge Physician: Darcel Dawley / Darcel Dawley   PCP: System, Provider Not In   Recommendations at discharge:    PCP follow up in 1 week. Iron , B12 level check as outpatient and supplement accordingly.  Discharge Diagnoses: Principal Problem:   COVID-19 Active Problems:   HLD (hyperlipidemia)   Parkinsonism, unspecified (HCC)   PTSD (post-traumatic stress disorder)   Hyponatremia   Fall at home, initial encounter  Resolved Problems:   * No resolved hospital problems. [facility] Course: This 78 y.o. male with medical history significant for essential hypertension, Parkinson's disease, PTSD, history of prior brain tumor who was brought in by family secondary to generalized weakness and multiple falls at home.  Patient had at least 3 episode of falling at home on day of admission where he slumped on the floor.  EMS was called all 3 times.  Ultimately after the third episode patient was brought to the ER with generalized weakness and fall.  There is associated fever and weakness.  Family believe patient had exposure to the flu.  Wife at bedside.  On arrival his temperature is 101.9 and heart rate 101-105.  He is 98% on room air and blood pressure was stable.  His respiratory rate however was 28.  Patient had workup for infectious sources and so far COVID-19 was positive.  Patient suspected to have acute illness with generalized weakness and fall as a result of COVID-19 infection.  He is not having any respiratory symptoms.  Not hypoxic.  Patient admitted for further supportive care due to failure to thrive at home and recurrent falling.  Patient has made significant improvement.  Electrolyte replacement completed.  PT and OT recommended SNF.  Patient being discharged to SNF today.   Assessment and Plan: COVID-19 infection:  Patient has no  respiratory symptoms but appears to be dehydrated, weak, sepsis ruled out.    Continue supportive care.  He was taking methotrexate so he is relatively immunocompromised.   He will have been a good candidate for Paxlovid. No steroids and no remdesivir.   Patient has no respiratory symptoms and not hypoxic.   Hyponatremia:  Possible due to nutritional deficiency and dehydration. Sodium improved with fluids.   Hyperlipidemia: Continue statin   Parkinson's disease: Confirmed on resume home regimen Sinemet , Denepezil.     PTSD: Continue home regimen.   Iron  deficiency, Tsat 11% Venofer  300 mg IV one-time dose given, continue oral iron  supplement and follow with PCP.   H/o LLE on Eliquis .  Patient is following vascular surgery as an outpatient who will let her know when to discontinue DOAC   S/p Multiple falls at home: Probably from infection as a result of COVID-19.  Patient is overall weak.   Debility and deconditioning, PT and OT evaluation done, recommended SNF placement. Vitamin D  level within normal range.   Vitamin B12 level 376, goal >400, started vitamin B12 oral supplement.  Repeat B12 level after 3 to 6 months.  Follow with PCP.   Anorexia, decreased appetite Now eating fair. Stopped Megace . Continue protein supplements. Assist with feeding.    Consultants: Palliative. Procedures performed: none  Disposition: Skilled nursing facility Diet recommendation:  Discharge Diet Orders (From admission, onward)     Start     Ordered   08/08/24 0000  Diet - low sodium heart healthy  Assist with feeding. 08/08/24 0959           Cardiac diet DISCHARGE MEDICATION: Allergies as of 08/09/2024       Reactions   Amlodipine Swelling   Hydrochlorothiazide Other (See Comments)   Other Reaction(s): Low blood pressure   Terazosin    Other Reaction(s): Dizziness, Syncope Other Reaction(s): Syncope        Medication List     TAKE these medications    ACIDOPHILUS  LACTOBACILLUS PO Take 1 tablet by mouth at bedtime.   apixaban  5 MG Tabs tablet Commonly known as: ELIQUIS  Take 1 tablet (5 mg total) by mouth 2 (two) times daily. What changed: how much to take   ascorbic acid  500 MG tablet Commonly known as: VITAMIN C  Take 1 tablet (500 mg total) by mouth daily.   atorvastatin 20 MG tablet Commonly known as: LIPITOR Take 10 mg by mouth at bedtime.   bisacodyl  5 MG EC tablet Commonly known as: DULCOLAX Take 2 tablets (10 mg total) by mouth at bedtime.   carbamide peroxide 6.5 % OTIC solution Commonly known as: DEBROX Place 5 drops into both ears 2 (two) times daily. 28 days   carbidopa -levodopa  25-100 MG tablet Commonly known as: SINEMET  IR Take 1 tablet by mouth 2 (two) times daily with breakfast and lunch. May take an extra tablet at bedtime if needed.   carbidopa -levodopa  50-200 MG tablet Commonly known as: SINEMET  CR Take 2-3 tablets by mouth in the morning. 3 tablet in morning, 3 tablets in the afternoon, 2 tablets at bedtime.   Cholecalciferol 25 MCG (1000 UT) tablet Take 1,000 Units by mouth daily.   clonazePAM  1 MG tablet Commonly known as: KLONOPIN  Take 1.5 mg by mouth at bedtime.   cyanocobalamin  1000 MCG tablet Take 1 tablet (1,000 mcg total) by mouth daily.   docusate sodium  100 MG capsule Commonly known as: COLACE Take 1 capsule (100 mg total) by mouth 2 (two) times daily.   donepezil  10 MG tablet Commonly known as: ARICEPT  Take 10 mg by mouth at bedtime.   Ensure Original Liqd Take by mouth daily.   finasteride  5 MG tablet Commonly known as: PROSCAR  TAKE ONE TABLET BY MOUTH ONCE EVERY DAY FOR PROSTATE   fluticasone  50 MCG/ACT nasal spray Commonly known as: FLONASE  Place 2 sprays into both nostrils at bedtime.   folic acid 1 MG tablet Commonly known as: FOLVITE Take 0.5 tablets by mouth daily.   gabapentin  100 MG capsule Commonly known as: NEURONTIN  Take 100 mg by mouth daily.   Iron  (Ferrous Sulfate )  325 (65 Fe) MG Tabs Take 1 tablet by mouth daily.   leptospermum manuka honey gel Apply 1 Application topically 2 (two) times daily. TO BUTTOCK AREA   Melatonin 5 MG Caps Take 1 capsule by mouth daily at 8 pm.   methotrexate 2.5 MG tablet Commonly known as: RHEUMATREX Take 8 tablets by mouth once a week. TAKE ON AN EMPTY STOMACH ONCE A WEEK THE SAME DAY OF THE WEEK(4 TABLETS IN THE MORNING AND 4 TABLETS AT NIGHT-TUESDAY ONLY)   metoprolol  succinate 25 MG 24 hr tablet Commonly known as: TOPROL -XL Take 12.5 mg by mouth daily.   nystatin ointment Commonly known as: MYCOSTATIN Apply 1 Application topically daily as needed. TO GROIN   pantoprazole  40 MG tablet Commonly known as: PROTONIX  Take 1 tablet (40 mg total) by mouth daily.   Pimavanserin  Tartrate 34 MG Caps Take 1 capsule by mouth at bedtime.   polyethylene glycol 17 g  packet Commonly known as: MIRALAX  / GLYCOLAX  Take 8.5 g by mouth daily.   silver sulfADIAZINE 1 % cream Commonly known as: SILVADENE Apply 1 Application topically daily. APPLY TO BUTTOCK AREA AFTER MEDIHONEY   Skintegrity Wound Liqd Apply topically as needed.   sodium chloride  0.65 % Soln nasal spray Commonly known as: OCEAN Place 1 spray into both nostrils as needed for congestion.   tamsulosin  0.4 MG Caps capsule Commonly known as: FLOMAX  Take 0.4 mg by mouth at bedtime.   traZODone  100 MG tablet Commonly known as: DESYREL  Take 100 mg by mouth at bedtime.   venlafaxine  XR 150 MG 24 hr capsule Commonly known as: EFFEXOR -XR TAKE ONE CAPSULE BY MOUTH AT BEDTIME FOR ANXIETY, MOOD, AND POSTTRAUMATIC STRESS DISORDER   zinc oxide 20 % ointment Apply 1 Application topically as needed for irritation.               Discharge Care Instructions  (From admission, onward)           Start     Ordered   08/08/24 0000  Discharge wound care:       Comments: Cleanse sacral wound with NS, apply Medihoney to wound bed daily and fill in wound  with dry gauze.  Secure with silicone foam or ABD pad whichever is preferred.   08/08/24 0959            Contact information for after-discharge care     Destination     [facility] .   Service: Skilled Nursing Contact information: [address] [place]  [address] [phone]                    Discharge Exam:  Patient was seen and examined at bedside.  Overnight events noted. Patient appears much improved.  He feels he is ready to be discharged to SNF.  Filed Weights   07/29/24 1858  Weight: 82.6 kg      08/09/2024    9:08 AM 08/09/2024    5:43 AM 08/08/2024    8:19 PM  Vitals with BMI  Systolic 115 113 887  Diastolic 76 73 77  Pulse 82 91 92   General - Elderly Caucasian weak male, no apparent distress HEENT - PERRLA, EOMI, atraumatic head, non tender sinuses. Lung - Clear, basal rales, rhonchi, no wheezes. Heart - S1, S2 heard, no murmurs, rubs, trace pedal edema. Abdomen - Soft, non tender, bowel sounds good Neuro - Alert, awake and oriented x 3, non focal exam. Skin - Warm and dry.  Condition at discharge: stable  The results of significant diagnostics from this hospitalization (including imaging, microbiology, ancillary and laboratory) are listed below for reference.   Imaging Studies: CT HEAD WO CONTRAST ( ) Result Date: 08/07/2024 CLINICAL DATA:  Head trauma, minor, normal mental status (Age 6-64y) EXAM: CT HEAD WITHOUT CONTRAST TECHNIQUE: Contiguous axial images were obtained from the base of the skull through the vertex without intravenous contrast. RADIATION DOSE REDUCTION: This exam was performed according to the departmental dose-optimization program which includes automated exposure control, adjustment of the mA and/or kV according to patient size and/or use of iterative reconstruction technique. COMPARISON:  CT head 01/14/2022 FINDINGS: Brain: Patchy and  confluent areas of decreased attenuation are noted throughout the deep and periventricular white matter of the cerebral hemispheres bilaterally, compatible with chronic microvascular ischemic disease. No evidence of large-territorial acute infarction. No parenchymal hemorrhage. No mass lesion. No extra-axial collection.  No mass effect or midline shift. No hydrocephalus. Basilar cisterns are patent. Vascular: No hyperdense vessel. Skull: No acute fracture or focal lesion. Sinuses/Orbits: Sphenoid, ethmoid, frontal sinus wall hypertrophy suggestive of chronic sinusitis. Associated mucosal thickening. The mastoid air cells are clear. The orbits are unremarkable. Other: None. IMPRESSION: 1. No acute intracranial abnormality. 2. Chronic pansinusitis. Electronically Signed   By: Morgane  Naveau M.D.   On: 08/07/2024 17:08   CT CHEST WO CONTRAST Result Date: 08/07/2024 CLINICAL DATA:  Rib fracture suspected, traumatic r/o Rib fractures. COVID positive EXAM: CT CHEST WITHOUT CONTRAST TECHNIQUE: Multidetector CT imaging of the chest was performed following the standard protocol without IV contrast. RADIATION DOSE REDUCTION: This exam was performed according to the departmental dose-optimization program which includes automated exposure control, adjustment of the mA and/or kV according to patient size and/or use of iterative reconstruction technique. COMPARISON:  None Available. FINDINGS: Cardiovascular: The thoracic aorta is normal in caliber. The heart is normal in size. Trace pericardial effusion. Three-vessel coronary artery calcification. Atherosclerotic plaque. Lungs/Pleura: Right upper lobe surgical changes suggestive of wedge-shaped for section. Bilateral lower lobe dependent atelectasis. No focal consolidation. Subpleural triangular 5 mm pulmonary nodule. No pulmonary mass. No pulmonary contusion or laceration. No pneumatocele formation. No pleural effusion. No pneumothorax. No hemothorax. Mediastinum/Nodes: No  pneumomediastinum. The central airways are patent. The esophagus is unremarkable. The thyroid is unremarkable. Limited evaluation for hilar lymphadenopathy on this noncontrast study. No mediastinal or axillary lymphadenopathy. Musculoskeletal/Chest wall No chest wall mass. No acute rib or sternal fracture. No spinal fracture. Upper Abdomen: No acute abnormality. IMPRESSION: 1. No acute intrathoracic traumatic injury with limited evaluation on this noncontrast study. 2. No acute fracture or traumatic malalignment of the thoracic spine. 3. 5 mm pulmonary nodule. No follow-up needed if patient is low-risk.This recommendation follows the consensus statement: Guidelines for Management of Incidental Pulmonary Nodules Detected on CT Images: From the Fleischner Society 2017; Radiology 2017; 284:228-243. 4. Aortic Atherosclerosis (ICD10-I70.0) including three-vessel coronary artery calcification. Electronically Signed   By: Morgane  Naveau M.D.   On: 08/07/2024 17:07   DG Chest 2 View Result Date: 07/29/2024 CLINICAL DATA:  Fever EXAM: CHEST - 2 VIEW COMPARISON:  10/05/2011 FINDINGS: Heart and mediastinal contours are within normal limits. No focal opacities or effusions. No acute bony abnormality. IMPRESSION: No active cardiopulmonary disease. Electronically Signed   By: Franky Crease M.D.   On: 07/29/2024 19:52    Microbiology: Results for orders placed or performed during the hospital encounter of 07/29/24  Resp panel by RT-PCR (RSV, Flu A&B, Covid) Anterior Nasal Swab     Status: Abnormal   Collection Time: 07/29/24  7:15 PM   Specimen: Anterior Nasal Swab  Result Value Ref Range Status   SARS Coronavirus 2 by RT PCR POSITIVE (A) NEGATIVE Final    Comment: (NOTE) SARS-CoV-2 target nucleic acids are DETECTED.  The SARS-CoV-2 RNA is generally detectable in upper respiratory specimens during the acute phase of infection. Positive results are indicative of the presence of the identified virus, but do not  rule out bacterial infection or co-infection with other pathogens not detected by the test. Clinical correlation with patient history and other diagnostic information is necessary to determine patient infection status. The expected result is Negative.  Fact Sheet for Patients: BloggerCourse.com  Fact Sheet for Healthcare Providers: SeriousBroker.it  This test is not yet approved or cleared by the [place]  FDA and  has been authorized for detection and/or diagnosis of SARS-CoV-2 by FDA under an Emergency Use  Authorization (EUA).  This EUA will remain in effect (meaning this test can be used) for the duration of  the COVID-19 declaration under Section 564(b)(1) of the A ct, 21 U.S.C. section 360bbb-3(b)(1), unless the authorization is terminated or revoked sooner.     Influenza A by PCR NEGATIVE NEGATIVE Final   Influenza B by PCR NEGATIVE NEGATIVE Final    Comment: (NOTE) The Xpert Xpress SARS-CoV-2/FLU/RSV plus assay is intended as an aid in the diagnosis of influenza from Nasopharyngeal swab specimens and should not be used as a sole basis for treatment. Nasal washings and aspirates are unacceptable for Xpert Xpress SARS-CoV-2/FLU/RSV testing.  Fact Sheet for Patients: BloggerCourse.com  Fact Sheet for Healthcare Providers: SeriousBroker.it  This test is not yet approved or cleared by the [place]  FDA and has been authorized for detection and/or diagnosis of SARS-CoV-2 by FDA under an Emergency Use Authorization (EUA). This EUA will remain in effect (meaning this test can be used) for the duration of the COVID-19 declaration under Section 564(b)(1) of the Act, 21 U.S.C. section 360bbb-3(b)(1), unless the authorization is terminated or revoked.     Resp Syncytial Virus by PCR NEGATIVE NEGATIVE Final    Comment: (NOTE) Fact Sheet for  Patients: BloggerCourse.com  Fact Sheet for Healthcare Providers: SeriousBroker.it  This test is not yet approved or cleared by the [place]  FDA and has been authorized for detection and/or diagnosis of SARS-CoV-2 by FDA under an Emergency Use Authorization (EUA). This EUA will remain in effect (meaning this test can be used) for the duration of the COVID-19 declaration under Section 564(b)(1) of the Act, 21 U.S.C. section 360bbb-3(b)(1), unless the authorization is terminated or revoked.  Performed at [facility], [address] [place], [place] [address]   Blood culture (routine x 2)     Status: None   Collection Time: 07/29/24 10:18 PM   Specimen: BLOOD LEFT HAND  Result Value Ref Range Status   Specimen Description BLOOD LEFT HAND  Final   Special Requests   Final    BOTTLES DRAWN AEROBIC AND ANAEROBIC Blood Culture results may not be optimal due to an inadequate volume of blood received in culture bottles   Culture   Final    NO GROWTH 5 DAYS Performed at [facility], [address] [place], [place] [address]    Report Status 08/03/2024 FINAL  Final  Blood culture (routine x 2)     Status: None   Collection Time: 07/30/24  2:38 AM   Specimen: BLOOD RIGHT ARM  Result Value Ref Range Status   Specimen Description BLOOD RIGHT ARM  Final   Special Requests   Final    BOTTLES DRAWN AEROBIC AND ANAEROBIC Blood Culture adequate volume   Culture   Final    NO GROWTH 5 DAYS Performed at [facility], [address] [place], [place] [address]    Report Status 08/04/2024 FINAL  Final    Labs: CBC: Recent Labs  Lab 08/06/24 0559  WBC 5.4  HGB 12.5*  HCT 38.0*  MCV 97.4  PLT 211   Basic Metabolic Panel: No results for input(s): NA, K, CL, CO2, GLUCOSE, BUN, CREATININE, CALCIUM, MG, PHOS in the last 168 hours.  Liver Function Tests: No results for input(s):  AST, ALT, ALKPHOS, BILITOT, PROT, ALBUMIN in the last 168 hours. CBG: Recent Labs  Lab 08/03/24 0819  GLUCAP 79    Discharge time spent: 35 minutes.  Signed: Darcel Dawley, MD [facility] 08/09/2024

## 2024-08-09 DIAGNOSIS — U071 COVID-19: Secondary | ICD-10-CM | POA: Diagnosis not present

## 2024-08-09 NOTE — Progress Notes (Signed)
 Patient being discharged to [facility].  Patient to be transported by EMS.  Wife updated on discharge.  Home medications stored in pharmacy returned to the wife.  No IV to remove.  Report given to receiving RN at [facility].

## 2024-08-09 NOTE — Progress Notes (Signed)
 Mobility Specialist - Progress Note   08/09/24 1010  Mobility  Activity Pivoted/transferred to/from BSC  Level of Assistance Minimal assist, patient does 75% or more (+2)  Distance Ambulated (ft) 4 ft  Activity Response Tolerated well  Mobility visit 1 Mobility  Mobility Specialist Start Time (ACUTE ONLY) 0920  Mobility Specialist Stop Time (ACUTE ONLY) 0931  Mobility Specialist Time Calculation (min) (ACUTE ONLY) 11 min   Pt supine upon entry, utilizing RA. Pt completed bed mob ModA, STS to RW and transferred to the [facility] via SPT MinA +2--- extra time required during transfer as Pt is sliding BLE. Pt left seated on the commode with call bell and instructions to utilize the call bell when ready.  America Silvan Mobility Specialist 08/09/24 10:16 AM

## 2024-08-09 NOTE — TOC Transition Note (Signed)
 Transition of Care [facility] - Discharge Note   Patient Details  Name: CIRILO CANNER MRN: [id number] Date of Birth: 1946-08-15  Transition of Care [facility] CM/SW Contact:  Corean ONEIDA Haddock, RN Phone Number: 08/09/2024, 12:51 PM   Clinical Narrative:     Patient will DC to: [facility] under [place] auth Anticipated DC date: 08/09/24  Family notified: Wife Mrs Chief Financial Officer by: Zona  Per MD patient ready for DC to . RN, patient, patient's family, and facility notified of DC. Discharge Summary sent to facility. RN given number for report. DC packet on chart. Ambulance transport requested for patient.   TOC signing off.         Patient Goals and CMS Choice            Discharge Placement                       Discharge Plan and Services Additional resources added to the After Visit Summary for                                       Social Drivers of Health (SDOH) Interventions SDOH Screenings   Food Insecurity: No Food Insecurity (07/30/2024)  Housing: Low Risk  (07/30/2024)  Transportation Needs: No Transportation Needs (07/30/2024)  Utilities: Not At Risk (07/30/2024)  Financial Resource Strain: Low Risk  (01/26/2023)   Received from [facility]  Social Connections: Moderately Isolated (07/30/2024)  Tobacco Use: Medium Risk (07/29/2024)     Readmission Risk Interventions     No data to display

## 2024-08-12 DIAGNOSIS — Z8616 Personal history of COVID-19: Secondary | ICD-10-CM | POA: Diagnosis not present

## 2024-08-12 DIAGNOSIS — G20A1 Parkinson's disease without dyskinesia, without mention of fluctuations: Secondary | ICD-10-CM | POA: Diagnosis not present

## 2024-08-12 DIAGNOSIS — E785 Hyperlipidemia, unspecified: Secondary | ICD-10-CM | POA: Diagnosis not present

## 2024-08-12 DIAGNOSIS — E871 Hypo-osmolality and hyponatremia: Secondary | ICD-10-CM | POA: Diagnosis not present

## 2024-08-14 DIAGNOSIS — E871 Hypo-osmolality and hyponatremia: Secondary | ICD-10-CM | POA: Diagnosis not present

## 2024-08-14 DIAGNOSIS — Z8616 Personal history of COVID-19: Secondary | ICD-10-CM | POA: Diagnosis not present

## 2024-08-14 DIAGNOSIS — G20A1 Parkinson's disease without dyskinesia, without mention of fluctuations: Secondary | ICD-10-CM | POA: Diagnosis not present

## 2024-08-14 DIAGNOSIS — E785 Hyperlipidemia, unspecified: Secondary | ICD-10-CM | POA: Diagnosis not present

## 2024-08-15 DIAGNOSIS — I10 Essential (primary) hypertension: Secondary | ICD-10-CM | POA: Diagnosis not present

## 2024-08-15 DIAGNOSIS — Z79899 Other long term (current) drug therapy: Secondary | ICD-10-CM | POA: Diagnosis not present

## 2024-08-16 DIAGNOSIS — E785 Hyperlipidemia, unspecified: Secondary | ICD-10-CM | POA: Diagnosis not present

## 2024-08-16 DIAGNOSIS — G20A1 Parkinson's disease without dyskinesia, without mention of fluctuations: Secondary | ICD-10-CM | POA: Diagnosis not present

## 2024-08-16 DIAGNOSIS — E871 Hypo-osmolality and hyponatremia: Secondary | ICD-10-CM | POA: Diagnosis not present

## 2024-08-16 DIAGNOSIS — Z8616 Personal history of COVID-19: Secondary | ICD-10-CM | POA: Diagnosis not present

## 2024-08-19 DIAGNOSIS — D496 Neoplasm of unspecified behavior of brain: Secondary | ICD-10-CM | POA: Diagnosis not present

## 2024-08-19 DIAGNOSIS — F02818 Dementia in other diseases classified elsewhere, unspecified severity, with other behavioral disturbance: Secondary | ICD-10-CM | POA: Diagnosis not present

## 2024-08-19 DIAGNOSIS — D519 Vitamin B12 deficiency anemia, unspecified: Secondary | ICD-10-CM | POA: Diagnosis not present

## 2024-08-19 DIAGNOSIS — G20B1 Parkinson's disease with dyskinesia, without mention of fluctuations: Secondary | ICD-10-CM | POA: Diagnosis not present

## 2024-09-11 DIAGNOSIS — L570 Actinic keratosis: Secondary | ICD-10-CM | POA: Diagnosis not present

## 2024-10-02 DIAGNOSIS — Z9889 Other specified postprocedural states: Secondary | ICD-10-CM | POA: Diagnosis not present

## 2024-10-02 DIAGNOSIS — J310 Chronic rhinitis: Secondary | ICD-10-CM | POA: Diagnosis not present

## 2024-10-02 DIAGNOSIS — J3489 Other specified disorders of nose and nasal sinuses: Secondary | ICD-10-CM | POA: Diagnosis not present

## 2024-10-02 DIAGNOSIS — M3130 Wegener's granulomatosis without renal involvement: Secondary | ICD-10-CM | POA: Diagnosis not present

## 2024-10-28 ENCOUNTER — Telehealth (INDEPENDENT_AMBULATORY_CARE_PROVIDER_SITE_OTHER): Payer: Self-pay

## 2024-10-28 NOTE — Telephone Encounter (Signed)
 Patient spouse left a message stating starting yesterday afternoon her husband began having left leg pain at 3 out 10. The inside of the leg is tender and there is knot on side of knee. The patient is currently on Asprin. Patient was recommended to go to the ED for further evaluation. Patient spouse verbalized understanding.

## 2024-11-12 ENCOUNTER — Telehealth (INDEPENDENT_AMBULATORY_CARE_PROVIDER_SITE_OTHER): Payer: Self-pay

## 2024-11-12 NOTE — Telephone Encounter (Signed)
 Patient spouse left a message stating that for past couple days her husband left leg has been tender, feeling discomfort, on and off pain at 3 out 10, and some pinkness color. I spoke with Dr Marea and he recommended for the patient contact PCP for further evaluation. Patient verbalized understanding to medical advice.
